# Patient Record
Sex: Male | Born: 1970 | Hispanic: No | Smoking: Current some day smoker
Health system: Southern US, Community
[De-identification: ages and names within clinical notes are randomized; demographics above are authoritative.]

## PROBLEM LIST (undated history)

## (undated) ENCOUNTER — Ambulatory Visit: Admission: EM | Payer: Commercial Managed Care - PPO

## (undated) DIAGNOSIS — G4733 Obstructive sleep apnea (adult) (pediatric): Secondary | ICD-10-CM

## (undated) DIAGNOSIS — F319 Bipolar disorder, unspecified: Secondary | ICD-10-CM

## (undated) DIAGNOSIS — E162 Hypoglycemia, unspecified: Secondary | ICD-10-CM

## (undated) DIAGNOSIS — K219 Gastro-esophageal reflux disease without esophagitis: Secondary | ICD-10-CM

## (undated) DIAGNOSIS — M5412 Radiculopathy, cervical region: Secondary | ICD-10-CM

## (undated) DIAGNOSIS — J4520 Mild intermittent asthma, uncomplicated: Secondary | ICD-10-CM

## (undated) HISTORY — DX: Mild intermittent asthma, uncomplicated: J45.20

## (undated) HISTORY — DX: Obstructive sleep apnea (adult) (pediatric): G47.33

## (undated) HISTORY — PX: TONSILLECTOMY: SUR1361

## (undated) HISTORY — DX: Radiculopathy, cervical region: M54.12

## (undated) HISTORY — DX: Gastro-esophageal reflux disease without esophagitis: K21.9

## (undated) HISTORY — DX: Bipolar disorder, unspecified: F31.9

---

## 1991-11-26 HISTORY — PX: NASAL FRACTURE SURGERY: SHX718

## 2007-04-15 ENCOUNTER — Ambulatory Visit: Payer: Self-pay | Admitting: Internal Medicine

## 2007-05-05 ENCOUNTER — Emergency Department: Payer: Self-pay | Admitting: Unknown Physician Specialty

## 2007-06-10 ENCOUNTER — Emergency Department: Payer: Self-pay | Admitting: Emergency Medicine

## 2008-03-17 ENCOUNTER — Inpatient Hospital Stay: Payer: Self-pay | Admitting: Psychiatry

## 2008-04-10 ENCOUNTER — Other Ambulatory Visit: Payer: Self-pay

## 2008-04-11 ENCOUNTER — Inpatient Hospital Stay: Payer: Self-pay | Admitting: Psychiatry

## 2009-09-16 ENCOUNTER — Inpatient Hospital Stay (HOSPITAL_COMMUNITY): Admission: AD | Admit: 2009-09-16 | Discharge: 2009-10-09 | Payer: Self-pay | Admitting: Psychiatry

## 2009-09-16 ENCOUNTER — Ambulatory Visit: Payer: Self-pay | Admitting: Psychiatry

## 2009-10-03 ENCOUNTER — Emergency Department (HOSPITAL_COMMUNITY): Admission: EM | Admit: 2009-10-03 | Discharge: 2009-10-03 | Payer: Self-pay | Admitting: Emergency Medicine

## 2011-02-27 LAB — SEX HORMONE BINDING GLOBULIN: Sex Hormone Binding: 17 nmol/L (ref 13–71)

## 2011-02-28 LAB — T3, FREE: T3, Free: 3.6 pg/mL (ref 2.3–4.2)

## 2011-02-28 LAB — FREE PSA: PSA, Free: 0.1 ng/mL

## 2011-02-28 LAB — HEPATITIS PANEL, ACUTE
HCV Ab: NEGATIVE
Hep A IgM: NEGATIVE

## 2011-02-28 LAB — T4, FREE: Free T4: 1.39 ng/dL (ref 0.80–1.80)

## 2011-02-28 LAB — HEPATITIS A ANTIBODY, IGM: Hep A IgM: NEGATIVE

## 2011-02-28 LAB — SEX HORMONE BINDING GLOBULIN: Sex Hormone Binding: 18 nmol/L (ref 13–71)

## 2017-06-16 ENCOUNTER — Ambulatory Visit
Admission: EM | Admit: 2017-06-16 | Discharge: 2017-06-16 | Disposition: A | Discharge status: Home / Self Care | Payer: 59 | Attending: Family Medicine | Admitting: Family Medicine

## 2017-06-16 ENCOUNTER — Encounter: Payer: Self-pay | Admitting: *Deleted

## 2017-06-16 ENCOUNTER — Ambulatory Visit (INDEPENDENT_AMBULATORY_CARE_PROVIDER_SITE_OTHER): Payer: 59

## 2017-06-16 DIAGNOSIS — R0602 Shortness of breath: Secondary | ICD-10-CM | POA: Diagnosis not present

## 2017-06-16 DIAGNOSIS — R0781 Pleurodynia: Secondary | ICD-10-CM

## 2017-06-16 DIAGNOSIS — R071 Chest pain on breathing: Secondary | ICD-10-CM | POA: Insufficient documentation

## 2017-06-16 DIAGNOSIS — F17200 Nicotine dependence, unspecified, uncomplicated: Secondary | ICD-10-CM | POA: Insufficient documentation

## 2017-06-16 DIAGNOSIS — M549 Dorsalgia, unspecified: Secondary | ICD-10-CM | POA: Diagnosis present

## 2017-06-16 DIAGNOSIS — M5489 Other dorsalgia: Secondary | ICD-10-CM | POA: Diagnosis not present

## 2017-06-16 LAB — FIBRIN DERIVATIVES D-DIMER (ARMC ONLY): Fibrin derivatives D-dimer (ARMC): 157.1 (ref 0.00–499.00)

## 2017-06-16 MED ORDER — PREDNISONE 20 MG PO TABS
ORAL_TABLET | ORAL | 0 refills | Status: DC
Start: 2017-06-16 — End: 2018-09-28

## 2017-06-16 NOTE — ED Provider Notes (Signed)
[facility]    CSN: [id number] Arrival date & time: 06/16/17  1837     History   Chief Complaint Chief Complaint  Patient presents with  . Chest Pain  . Shortness of Breath  . Back Pain    HPI Nicholas Horne is a 46 y.o. male.   The history is provided by the patient.  Chest Pain  Pain location:  Substernal area Pain quality: sharp   Pain radiates to:  Does not radiate Pain severity:  Mild Onset quality:  Sudden Duration:  1 week Timing:  Intermittent Progression:  Waxing and waning Chronicity:  New Context comment:  Worse with deep breaths Relieved by:  None tried Worsened by:  Deep breathing Associated symptoms: back pain and shortness of breath   Associated symptoms: no abdominal pain, no AICD problem, no altered mental status, no anorexia, no anxiety, no claudication, no cough, no dizziness, no dysphagia, no fatigue, no fever, no headache, no heartburn, no lower extremity edema, no nausea, no numbness, no orthopnea, no palpitations, no PND, no syncope, no vomiting and no weakness   Shortness of Breath  Associated symptoms: chest pain   Associated symptoms: no abdominal pain, no claudication, no cough, no fever, no headaches, no PND, no syncope and no vomiting   Back Pain  Associated symptoms: chest pain   Associated symptoms: no abdominal pain, no fever, no headaches, no numbness and no weakness     History reviewed. No pertinent past medical history.  There are no active problems to display for this patient.   History reviewed. No pertinent surgical history.  Wells Criteria=0   Home Medications    Prior to Admission medications   Medication Sig Start Date End Date Taking? Authorizing Provider  predniSONE (DELTASONE) 20 MG tablet 2 tabs po qd for 5 days 06/16/17   Payton Mccallumonty, Milissa Fesperman, MD    Family History History reviewed. No pertinent family history.  Social History Social History  Substance Use Topics  . Smoking status: Current Every  Day Smoker  . Smokeless tobacco: Never Used  . Alcohol use No     Allergies   Patient has no known allergies.   Review of Systems Review of Systems  Constitutional: Negative for fatigue and fever.  HENT: Negative for trouble swallowing.   Respiratory: Positive for shortness of breath. Negative for cough.   Cardiovascular: Positive for chest pain. Negative for palpitations, orthopnea, claudication, syncope and PND.  Gastrointestinal: Negative for abdominal pain, anorexia, heartburn, nausea and vomiting.  Musculoskeletal: Positive for back pain.  Neurological: Negative for dizziness, weakness, numbness and headaches.     Physical Exam Triage Vital Signs ED Triage Vitals  Enc Vitals Group     BP 06/16/17 1852 125/79     Pulse Rate 06/16/17 1852 69     Resp 06/16/17 1852 16     Temp 06/16/17 1852 97.9 F (36.6 C)     Temp Source 06/16/17 1852 Oral     SpO2 06/16/17 1852 99 %     Weight 06/16/17 1854 197 lb (89.4 kg)     Height 06/16/17 1854 5\' 10"  (1.778 m)     Head Circumference --      Peak Flow --      Pain Score 06/16/17 1854 5     Pain Loc --      Pain Edu? --      Excl. in GC? --    No data found.   Updated Vital Signs BP 125/79 (BP Location:  Left Arm)   Pulse 69   Temp 97.9 F (36.6 C) (Oral)   Resp 16   Ht 5\' 10"  (1.778 m)   Wt 197 lb (89.4 kg)   SpO2 99%   BMI 28.27 kg/m   Visual Acuity Right Eye Distance:   Left Eye Distance:   Bilateral Distance:    Right Eye Near:   Left Eye Near:    Bilateral Near:     Physical Exam  Constitutional: He appears well-developed and well-nourished. No distress.  HENT:  Head: Normocephalic and atraumatic.  Right Ear: Tympanic membrane, external ear and ear canal normal.  Left Ear: Tympanic membrane, external ear and ear canal normal.  Nose: Nose normal.  Mouth/Throat: Uvula is midline, oropharynx is clear and moist and mucous membranes are normal. No oropharyngeal exudate or tonsillar abscesses.  Eyes:  Pupils are equal, round, and reactive to light.  Neck: Normal range of motion. Neck supple. No tracheal deviation present.  Cardiovascular: Normal rate, regular rhythm, normal heart sounds and intact distal pulses.   Pulmonary/Chest: Effort normal and breath sounds normal. No stridor. No respiratory distress. He has no wheezes. He has no rales. He exhibits no tenderness.  Abdominal: Soft. Bowel sounds are normal. He exhibits no distension. There is no tenderness.  Neurological: He is alert.  Skin: Skin is warm and dry. No rash noted. He is not diaphoretic.  Nursing note and vitals reviewed.    UC Treatments / Results  Labs (all labs ordered are listed, but only abnormal results are displayed) Labs Reviewed  FIBRIN DERIVATIVES D-DIMER [facility] ONLY)    EKG  EKG Interpretation None       Radiology Dg Chest 2 View  Result Date: 06/16/2017 CLINICAL DATA:  46 year old male with shortness of breath weakness for the past week and now chest pain EXAM: CHEST  2 VIEW COMPARISON:  None. FINDINGS: The lungs are clear and negative for focal airspace consolidation, pulmonary edema or suspicious pulmonary nodule. No pleural effusion or pneumothorax. Cardiac and mediastinal contours are within normal limits. No acute fracture or lytic or blastic osseous lesions. The visualized upper abdominal bowel gas pattern is unremarkable. IMPRESSION: Negative chest x-ray. Electronically Signed   By: Malachy Moan M.D.   On: 06/16/2017 19:22    Procedures ED EKG Date/Time: 06/16/2017 7:36 PM Performed by: Payton Mccallum Authorized by: Payton Mccallum   ECG reviewed by ED Physician in the absence of a cardiologist: yes   Previous ECG:    Previous ECG:  Unavailable Interpretation:    Interpretation: normal   Rate:    ECG rate:  62   ECG rate assessment: normal   Rhythm:    Rhythm: sinus rhythm   Ectopy:    Ectopy: none   QRS:    QRS axis:  Normal Conduction:    Conduction: normal   ST segments:      ST segments:  Normal T waves:    T waves: normal      (including critical care time)  Medications Ordered in UC Medications - No data to display   Initial Impression / Assessment and Plan / UC Course  I have reviewed the triage vital signs and the nursing notes.  Pertinent labs & imaging results that were available during my care of the patient were reviewed by me and considered in my medical decision making (see chart for details).       Final Clinical Impressions(s) / UC Diagnoses   Final diagnoses:  Chest pain on breathing  Pleuritic chest pain    New Prescriptions Discharge Medication List as of 06/16/2017  8:30 PM    START taking these medications   Details  predniSONE (DELTASONE) 20 MG tablet 2 tabs po qd for 5 days, Normal       1. Labs/x-ray/ekg results (negative) and diagnosis reviewed with patient 2. rx as per orders above; reviewed possible side effects, interactions, risks and benefits  3. Recommend supportive treatment with otc analgesics prn 4. Follow-up prn if symptoms worsen or don't improve   Payton Mccallum, MD 06/16/17 2035

## 2017-06-16 NOTE — ED Triage Notes (Signed)
Chest pain with respiration, itchy throat, states "I can't get a full breath" also low back pain, onset 1 week ago. Symptoms started off mild and have progressed.

## 2018-09-28 ENCOUNTER — Other Ambulatory Visit: Payer: Self-pay

## 2018-09-28 ENCOUNTER — Emergency Department: Payer: Self-pay

## 2018-09-28 ENCOUNTER — Emergency Department
Admission: EM | Admit: 2018-09-28 | Discharge: 2018-09-28 | Disposition: A | Discharge status: Other Acute Inpatient Hospital | Payer: Self-pay | Attending: Emergency Medicine | Admitting: Emergency Medicine

## 2018-09-28 ENCOUNTER — Encounter: Payer: Self-pay | Admitting: Psychiatry

## 2018-09-28 ENCOUNTER — Inpatient Hospital Stay
Admission: RE | Admit: 2018-09-28 | Discharge: 2018-10-02 | DRG: 885 | Disposition: A | Discharge status: Home / Self Care | Payer: No Typology Code available for payment source | Source: Ambulatory Visit | Attending: Psychiatry | Admitting: Psychiatry

## 2018-09-28 DIAGNOSIS — F17210 Nicotine dependence, cigarettes, uncomplicated: Secondary | ICD-10-CM | POA: Diagnosis present

## 2018-09-28 DIAGNOSIS — F315 Bipolar disorder, current episode depressed, severe, with psychotic features: Secondary | ICD-10-CM | POA: Insufficient documentation

## 2018-09-28 DIAGNOSIS — F314 Bipolar disorder, current episode depressed, severe, without psychotic features: Principal | ICD-10-CM | POA: Diagnosis present

## 2018-09-28 DIAGNOSIS — R45851 Suicidal ideations: Secondary | ICD-10-CM

## 2018-09-28 DIAGNOSIS — F1490 Cocaine use, unspecified, uncomplicated: Secondary | ICD-10-CM | POA: Diagnosis present

## 2018-09-28 DIAGNOSIS — G4700 Insomnia, unspecified: Secondary | ICD-10-CM | POA: Diagnosis present

## 2018-09-28 DIAGNOSIS — F429 Obsessive-compulsive disorder, unspecified: Secondary | ICD-10-CM | POA: Diagnosis present

## 2018-09-28 DIAGNOSIS — F319 Bipolar disorder, unspecified: Secondary | ICD-10-CM

## 2018-09-28 DIAGNOSIS — D72829 Elevated white blood cell count, unspecified: Secondary | ICD-10-CM

## 2018-09-28 DIAGNOSIS — J40 Bronchitis, not specified as acute or chronic: Secondary | ICD-10-CM | POA: Diagnosis present

## 2018-09-28 DIAGNOSIS — F1420 Cocaine dependence, uncomplicated: Secondary | ICD-10-CM

## 2018-09-28 DIAGNOSIS — D72828 Other elevated white blood cell count: Secondary | ICD-10-CM

## 2018-09-28 DIAGNOSIS — F17200 Nicotine dependence, unspecified, uncomplicated: Secondary | ICD-10-CM | POA: Insufficient documentation

## 2018-09-28 DIAGNOSIS — F4310 Post-traumatic stress disorder, unspecified: Secondary | ICD-10-CM | POA: Diagnosis present

## 2018-09-28 LAB — URINE DRUG SCREEN, QUALITATIVE (ARMC ONLY)
AMPHETAMINES, UR SCREEN: NOT DETECTED
Barbiturates, Ur Screen: NOT DETECTED
Benzodiazepine, Ur Scrn: NOT DETECTED
COCAINE METABOLITE, UR ~~LOC~~: POSITIVE — AB
Cannabinoid 50 Ng, Ur ~~LOC~~: NOT DETECTED
MDMA (Ecstasy)Ur Screen: NOT DETECTED
Methadone Scn, Ur: NOT DETECTED
OPIATE, UR SCREEN: NOT DETECTED
Phencyclidine (PCP) Ur S: NOT DETECTED
Tricyclic, Ur Screen: NOT DETECTED

## 2018-09-28 LAB — COMPREHENSIVE METABOLIC PANEL
ALT: 50 U/L — AB (ref 0–44)
AST: 49 U/L — AB (ref 15–41)
Albumin: 4.7 g/dL (ref 3.5–5.0)
Alkaline Phosphatase: 60 U/L (ref 38–126)
Anion gap: 9 (ref 5–15)
BUN: 19 mg/dL (ref 6–20)
CO2: 28 mmol/L (ref 22–32)
CREATININE: 1.33 mg/dL — AB (ref 0.61–1.24)
Calcium: 9.3 mg/dL (ref 8.9–10.3)
Chloride: 102 mmol/L (ref 98–111)
GFR calc Af Amer: >60 mL/min (ref >60)
GFR calc non Af Amer: >60 mL/min (ref >60)
Glucose, Bld: 118 mg/dL — ABNORMAL HIGH (ref 70–99)
Potassium: 4.4 mmol/L (ref 3.5–5.1)
SODIUM: 139 mmol/L (ref 135–145)
Total Bilirubin: 1.3 mg/dL — ABNORMAL HIGH (ref 0.3–1.2)
Total Protein: 8.5 g/dL — ABNORMAL HIGH (ref 6.5–8.1)

## 2018-09-28 LAB — DIFFERENTIAL
Basophils Absolute: 0.1 10*3/uL (ref 0.0–0.1)
Basophils Relative: 0 %
Eosinophils Absolute: 0 10*3/uL (ref 0.0–0.5)
Eosinophils Relative: 0 %
LYMPHS ABS: 2 10*3/uL (ref 0.7–4.0)
LYMPHS PCT: 11 %
MONO ABS: 2 10*3/uL — AB (ref 0.1–1.0)
MONOS PCT: 11 %
NEUTROS ABS: 14.4 10*3/uL — AB (ref 1.7–7.7)
Neutrophils Relative %: 77 %

## 2018-09-28 LAB — CBC
HEMATOCRIT: 48.4 % (ref 39.0–52.0)
Hemoglobin: 16.3 g/dL (ref 13.0–17.0)
MCH: 30.8 pg (ref 26.0–34.0)
MCHC: 33.7 g/dL (ref 30.0–36.0)
MCV: 91.5 fL (ref 80.0–100.0)
NRBC: 0 % (ref 0.0–0.2)
Platelets: 294 10*3/uL (ref 150–400)
RBC: 5.29 MIL/uL (ref 4.22–5.81)
RDW: 14.2 % (ref 11.5–15.5)
WBC: 19.1 10*3/uL — ABNORMAL HIGH (ref 4.0–10.5)

## 2018-09-28 LAB — SALICYLATE LEVEL: Salicylate Lvl: <7 mg/dL (ref 2.8–30.0)

## 2018-09-28 LAB — TROPONIN I: Troponin I: <0.03 ng/mL (ref <0.03)

## 2018-09-28 LAB — ACETAMINOPHEN LEVEL: Acetaminophen (Tylenol), Serum: <10 ug/mL — ABNORMAL LOW (ref 10–30)

## 2018-09-28 LAB — ETHANOL: Alcohol, Ethyl (B): <10 mg/dL (ref <10)

## 2018-09-28 MED ORDER — ALUM & MAG HYDROXIDE-SIMETH 200-200-20 MG/5ML PO SUSP
30.0000 mL | ORAL | Status: DC | PRN
  Administered 2018-10-02: 30 mL via ORAL
  Filled 2018-09-28: qty 30

## 2018-09-28 MED ORDER — ACETAMINOPHEN 500 MG PO TABS
1000.0000 mg | ORAL_TABLET | Freq: Once | ORAL | Status: AC
  Administered 2018-09-28: 1000 mg via ORAL
  Filled 2018-09-28: qty 2

## 2018-09-28 MED ORDER — TRAZODONE HCL 100 MG PO TABS: 100.0000 mg | ORAL_TABLET | Freq: Every evening | ORAL | Status: DC | PRN

## 2018-09-28 MED ORDER — MAGNESIUM HYDROXIDE 400 MG/5ML PO SUSP: 30.0000 mL | Freq: Every day | ORAL | Status: DC | PRN

## 2018-09-28 MED ORDER — HYDROXYZINE HCL 50 MG PO TABS
50.0000 mg | ORAL_TABLET | Freq: Three times a day (TID) | ORAL | Status: DC | PRN
  Administered 2018-09-28: 50 mg via ORAL
  Filled 2018-09-28: qty 1

## 2018-09-28 MED ORDER — ACETAMINOPHEN 325 MG PO TABS: 650.0000 mg | ORAL_TABLET | Freq: Four times a day (QID) | ORAL | Status: DC | PRN

## 2018-09-28 NOTE — ED Notes (Addendum)
[employer] officer at bedside during dress out. Vernona Rieger, ED Tech at bedside for dress out. Pair of brown boots, yellow shirt, silver colored cross necklace, brown belt, jeans, white socks, underwear and gym shorts, wallet-declines needing locked up in safe. Red cell phone placed in labeled patient belonging bag.

## 2018-09-28 NOTE — BH Assessment (Addendum)
Assessment Note  Nicholas Horne is an 47 y.o. male who presents to ED endorsing suicidal ideation with a plan to hang himself with a belt. He reports he borrowed a shotgun from his father and told his father it was to go hunting. He then told this Clinical research associate "it wasn't for hunting" (implying he was going to use the gun as a means to end his life). He denies HI/AVH. He reports sxs of depression - isolation, guilt, hopelessness, worthlessness. He shared that his only reason he has not followed through with his suicidal plans is because of "my 5yo son". He reports a recent "slip" using cocaine today. He currently lives with his wife and 5yo son. Pt also reports poor sleep patterns - 2-3 hours of sleep.  Pt reports past psychiatric hospitalization with [facility] in 2009 for similar presenting problems. Also reports various trials with different medications that he reports were ineffective (lithium and seroquel).   Pt denied current legal involvement.  Pt was alert and oriented x4, with a flat affect. Pt was pleasant and cooperative during TTS assessment.  Diagnosis: Bipolar 1 Disorder, Depressed Mood  Past Medical History: History reviewed. No pertinent past medical history.  History reviewed. No pertinent surgical history.  Family History: History reviewed. No pertinent family history.  Social History:  reports that he has been smoking. He has never used smokeless tobacco. He reports that he has current or past drug history. Drug: Cocaine. He reports that he does not drink alcohol.  Additional Social History:  Alcohol / Drug Use Pain Medications: See MAR Prescriptions: See MAR Over the Counter: See MAR History of alcohol / drug use?: Yes Longest period of sobriety (when/how long): 7 years Negative Consequences of Use: Financial, Personal relationships, Work / School Withdrawal Symptoms: Irritability Substance #1 Name of Substance 1: Cocaine 1 - Age of First Use: "Early 30s" 1 - Amount (size/oz):  "$10.00" 1 - Frequency: "a slip" 1 - Duration: UKN 1 - Last Use / Amount: 09/28/2018  CIWA: CIWA-Ar BP: 134/84 Pulse Rate: (!) 111 COWS:    Allergies: No Known Allergies  Home Medications:  (Not in a hospital admission)  OB/GYN Status:  No LMP for male patient.  General Assessment Data Location of Assessment: [facility] ED TTS Assessment: In system Is this a Tele or Face-to-Face Assessment?: Face-to-Face Is this an Initial Assessment or a Re-assessment for this encounter?: Initial Assessment Patient Accompanied by:: N/A Language Other than English: No Living Arrangements: Other (Comment)(Independent living) What gender do you identify as?: Male Marital status: Married [place] name: n/a Pregnancy Status: No Living Arrangements: Spouse/significant other, Children Can pt return to current living arrangement?: Yes Admission Status: Involuntary Petitioner: ED Attending Is patient capable of signing voluntary admission?: No Referral Source: Self/Family/Friend Insurance type: None  Medical Screening Exam [facility] Walk-in ONLY) Medical Exam completed: Yes  Crisis Care Plan Living Arrangements: Spouse/significant other, Children Legal Guardian: Other:(Self) Name of Psychiatrist: None Name of Therapist: None  Education Status Is patient currently in school?: No Is the patient employed, unemployed or receiving disability?: Employed([employer]  Risk to self with the past 6 months Suicidal Ideation: Yes-Currently Present Has patient been a risk to self within the past 6 months prior to admission? : Yes Suicidal Intent: Yes-Currently Present Has patient had any suicidal intent within the past 6 months prior to admission? : Yes Is patient at risk for suicide?: Yes Suicidal Plan?: Yes-Currently Present Has patient had any suicidal plan within the past 6 months prior to  admission? : Yes Specify Current Suicidal Plan: To hang himself with a belt or a shotgun. Access to  Means: Yes Specify Access to Suicidal Means: Access to a belt and a shotgun. What has been your use of drugs/alcohol within the last 12 months?: Cocaine Previous Attempts/Gestures: No How many times?: 0 Other Self Harm Risks: None Triggers for Past Attempts: None known Intentional Self Injurious Behavior: None Family Suicide History: Unknown Recent stressful life event(s): Conflict (Comment), Financial Problems, Other (Comment)(Relapse, Conflict with wife.) Persecutory voices/beliefs?: No Depression: Yes Depression Symptoms: Despondent, Insomnia, Tearfulness, Isolating, Fatigue, Guilt, Loss of interest in usual pleasures, Feeling worthless/self pity, Feeling angry/irritable Substance abuse history and/or treatment for substance abuse?: Yes Suicide prevention information given to non-admitted patients: Not applicable  Risk to Others within the past 6 months Homicidal Ideation: No Does patient have any lifetime risk of violence toward others beyond the six months prior to admission? : No Thoughts of Harm to Others: No Current Homicidal Intent: No Current Homicidal Plan: No Access to Homicidal Means: No Identified Victim: None History of harm to others?: No Assessment of Violence: None Noted Violent Behavior Description: None Does patient have access to weapons?: Yes (Comment)(Pt reports he has access to a shotgun.) Criminal Charges Pending?: No Does patient have a court date: No Is patient on probation?: No  Psychosis Hallucinations: None noted Delusions: None noted  Mental Status Report Appearance/Hygiene: In scrubs Eye Contact: Poor Motor Activity: Freedom of movement, Unremarkable Speech: Logical/coherent Level of Consciousness: Alert Mood: Depressed, Sad Affect: Flat, Depressed Anxiety Level: Minimal Thought Processes: Coherent, Relevant Judgement: Impaired Orientation: Person, Place, Time, Situation, Appropriate for developmental age Obsessive Compulsive  Thoughts/Behaviors: None  Cognitive Functioning Concentration: Normal Memory: Recent Intact, Remote Intact Is patient IDD: No Insight: Poor Impulse Control: Fair Appetite: Good Have you had any weight changes? : No Change Sleep: Decreased Total Hours of Sleep: 2 Vegetative Symptoms: None  ADLScreening [facility] Assessment Services) Patient's cognitive ability adequate to safely complete daily activities?: Yes Patient able to express need for assistance with ADLs?: Yes Independently performs ADLs?: Yes (appropriate for developmental age)  Prior Inpatient Therapy Prior Inpatient Therapy: Yes Prior Therapy Dates: 2009 Prior Therapy Facilty/Provider(s): [facility] Reason for Treatment: SI, Depression  Prior Outpatient Therapy Prior Outpatient Therapy: No Does patient have an ACCT team?: No Does patient have Intensive In-House Services?  : No Does patient have [facility] services? : No Does patient have P4CC services?: No  ADL Screening (condition at time of admission) Patient's cognitive ability adequate to safely complete daily activities?: Yes Patient able to express need for assistance with ADLs?: Yes Independently performs ADLs?: Yes (appropriate for developmental age)       Abuse/Neglect Assessment (Assessment to be complete while patient is alone) Abuse/Neglect Assessment Can Be Completed: Yes Physical Abuse: Denies Verbal Abuse: Denies Sexual Abuse: Denies Exploitation of patient/patient's resources: Denies Self-Neglect: Denies Values / Beliefs Cultural Requests During Hospitalization: None Spiritual Requests During Hospitalization: None Consults Spiritual Care Consult Needed: No Social Work Consult Needed: No Merchant navy officer (For Healthcare) Does Patient Have a Medical Advance Directive?: No       Child/Adolescent Assessment Running Away Risk: (Patient is an adult)  Disposition:  Disposition Initial Assessment Completed for this Encounter: Yes Disposition of  Patient: Admit Type of inpatient treatment program: Adult Patient refused recommended treatment: No Mode of transportation if patient is discharged?: N/A Patient referred to: [facility] BMU)  On Site Evaluation by:   Reviewed with Physician:    Wilmon Arms 09/28/2018 7:12 PM

## 2018-09-28 NOTE — ED Notes (Signed)
PT  PLACED UNDER  IVC PAPERS  PER  DR [facility]  INFORMED  RN  RHEA AND  ODS  OFFICER

## 2018-09-28 NOTE — ED Notes (Signed)
Report given to [facility] in [place]. Patient to be transferred after 2300. Patient agreeable with plan of care.

## 2018-09-28 NOTE — ED Notes (Signed)
Pt states that he feels overwhelmed.  He still endorses suicidal ideation here at the hospital.  Pt states he worries about work.  He states that the only reason that he didn't harm himself was because he has a 46 year old son.  Pt alert and oriented x 4.  Appears to be in no acute distress.

## 2018-09-28 NOTE — ED Provider Notes (Signed)
[facility] Emergency Department Provider Note   ____________________________________________   First MD Initiated Contact with Patient 09/28/18 1550     (approximate)  I have reviewed the triage vital signs and the nursing notes.   HISTORY  Chief Complaint Suicidal    HPI Nicholas Horne is a 47 y.o. male who reports he is feeling depressed like he wants to end it all.  He was thinking of hanging himself with his belt.  He is mumbling his history and hard to understand everything what sounds like somebody stopped them.  He came here for help.  He says his life is working everything is doing well but he just is tired wants to check out.   History reviewed. No pertinent past medical history.  Patient Active Problem List   Diagnosis Date Noted  . Bipolar 1 disorder, depressed (HCC) 09/28/2018  . Cocaine abuse (HCC) 09/28/2018  . Suicidal ideation 09/28/2018    History reviewed. No pertinent surgical history.  Prior to Admission medications   Not on File    Allergies Patient has no known allergies.  History reviewed. No pertinent family history.  Social History Social History   Tobacco Use  . Smoking status: Current Every Day Smoker  . Smokeless tobacco: Never Used  Substance Use Topics  . Alcohol use: No  . Drug use: Yes    Types: Cocaine    Review of Systems  Constitutional: No fever/chills Eyes: No visual changes. ENT: No sore throat. Cardiovascular: Denies chest pain. Respiratory: Denies shortness of breath. Gastrointestinal: No abdominal pain.  No nausea, no vomiting.  No diarrhea.  No constipation. Genitourinary: Negative for dysuria. Musculoskeletal: Negative for back pain. Skin: Negative for rash. Neurological: Negative for headaches, focal weakness   ____________________________________________   PHYSICAL EXAM:  VITAL SIGNS: ED Triage Vitals  Enc Vitals Group     BP 09/28/18 1530 (!) 155/82     Pulse Rate  09/28/18 1530 (!) 111     Resp --      Temp 09/28/18 1530 98.3 F (36.8 C)     Temp Source 09/28/18 1530 Oral     SpO2 09/28/18 1530 99 %     Weight 09/28/18 1532 208 lb (94.3 kg)     Height 09/28/18 1532 5\' 11"  (1.803 m)     Head Circumference --      Peak Flow --      Pain Score 09/28/18 1532 0     Pain Loc --      Pain Edu? --      Excl. in GC? --    Constitutional: Alert and oriented. Well appearing and in no acute distress. Eyes: Conjunctivae are normal. PER. EOMI. Head: Atraumatic. Nose: No congestion/rhinnorhea. Mouth/Throat: Mucous membranes are moist.  Oropharynx non-erythematous. Neck: No stridor.   Cardiovascular: Normal rate, regular rhythm. Grossly normal heart sounds.  Good peripheral circulation. Respiratory: Normal respiratory effort.  No retractions. Lungs CTAB. Gastrointestinal: Soft and nontender. No distention. No abdominal bruits. No CVA tenderness. Musculoskeletal: No lower extremity tenderness nor edema.   Neurologic:  Normal speech and language. No gross focal neurologic deficits are appreciated. No gait instability. Skin:  Skin is warm, dry and intact. No rash noted. Psychiatric: Mood and affect are normal. Speech and behavior are normal.  ____________________________________________   LABS (all labs ordered are listed, but only abnormal results are displayed)  Labs Reviewed  COMPREHENSIVE METABOLIC PANEL - Abnormal; Notable for the following components:      Result Value  Glucose, Bld 118 (*)    Creatinine, Ser 1.33 (*)    Total Protein 8.5 (*)    AST 49 (*)    ALT 50 (*)    Total Bilirubin 1.3 (*)    All other components within normal limits  ACETAMINOPHEN LEVEL - Abnormal; Notable for the following components:   Acetaminophen (Tylenol), Serum <10 (*)    All other components within normal limits  CBC - Abnormal; Notable for the following components:   WBC 19.1 (*)    All other components within normal limits  URINE DRUG SCREEN, QUALITATIVE  ([facility] ONLY) - Abnormal; Notable for the following components:   Cocaine Metabolite,Ur Protection POSITIVE (*)    All other components within normal limits  DIFFERENTIAL - Abnormal; Notable for the following components:   Neutro Abs 14.4 (*)    Monocytes Absolute 2.0 (*)    All other components within normal limits  ETHANOL  SALICYLATE LEVEL  TROPONIN I   ____________________________________________  EKG   ____________________________________________  RADIOLOGY  ED MD interpretation:    Official radiology report(s): Dg Chest 2 View  Result Date: 09/28/2018 CLINICAL DATA:  Elevated white count EXAM: CHEST - 2 VIEW COMPARISON:  06/16/2017 FINDINGS: The heart size and mediastinal contours are within normal limits. Both lungs are clear. The visualized skeletal structures are unremarkable. IMPRESSION: No active cardiopulmonary disease. Electronically Signed   By: Jasmine Pang M.D.   On: 09/28/2018 18:03   US Abdomen Limited Ruq  Result Date: 09/28/2018 CLINICAL DATA:  Fatigue with leukocytosis. EXAM: ULTRASOUND ABDOMEN LIMITED RIGHT UPPER QUADRANT COMPARISON:  None. FINDINGS: Gallbladder: Tiny cholesterol polyps evident. No findings to suggest gallstones. No gallbladder wall thickening or pericholecystic fluid. The sonographer reports no sonographic Murphy sign. Common bile duct: Diameter: 2 mm Liver: No focal lesion identified. Within normal limits in parenchymal echogenicity. Portal vein is patent on color Doppler imaging with normal direction of blood flow towards the liver. IMPRESSION: 1. No acute findings. No evidence to explain the patient's history of leukocytosis. Electronically Signed   By: Kennith Center M.D.   On: 09/28/2018 18:23    ____________________________________________   PROCEDURES  Procedure(s) performed:   Procedures  Critical Care performed:   ____________________________________________   INITIAL IMPRESSION / ASSESSMENT AND PLAN / ED COURSE  We will consult  TTS and psychiatry.  This gentleman is suicidal. Patient to be admitted downstairs.  Not sure why he has an elevated white count.  His physical exam is essentially normal.  His chest x-ray is negative LFTs are slightly elevated but his ultrasound is normal his troponin is negative I think we will just have him watch downstairs.        ____________________________________________   FINAL CLINICAL IMPRESSION(S) / ED DIAGNOSES  Final diagnoses:  Suicidal ideation  Other elevated white blood cell (WBC) count     ED Discharge Orders    None       Note:  This document was prepared using Dragon voice recognition software and may include unintentional dictation errors.    Arnaldo Natal, MD 09/28/18 2019
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
## 2018-09-28 NOTE — BH Assessment (Signed)
Patient is to be admitted to [facility] by Dr. Toni Amend.  Attending Physician will be Dr. Jennet Maduro.   Patient has been assigned to room 306-A, by [facility] Charge Nurse Gigi.   Intake Paper Work has been signed and placed on patient chart.  ER staff is aware of the admission:  [facility] ER Secretary    Dr. Darnelle Catalan, ER MD   Prudencio Burly, Patient's Nurse   Ethelene Browns, Patient Access.

## 2018-09-28 NOTE — ED Notes (Signed)
IV[facility]/  PENDING  PLACEMENT 

## 2018-09-28 NOTE — ED Triage Notes (Signed)
Pt to ER via POV stating that he is having suicidal thoughts. Pt states that he wanted to hang himself with belt today. Denies HI.

## 2018-09-28 NOTE — ED Notes (Signed)
Prudencio Burly, RN and charge RN Judeth Cornfield informed that 1:1 suicidal precautions are ordered for patient due to being actively suicidal.

## 2018-09-29 ENCOUNTER — Encounter: Payer: Self-pay | Admitting: Psychiatry

## 2018-09-29 DIAGNOSIS — F17200 Nicotine dependence, unspecified, uncomplicated: Secondary | ICD-10-CM | POA: Diagnosis present

## 2018-09-29 LAB — LIPID PANEL
CHOL/HDL RATIO: 4.5 ratio
CHOLESTEROL: 193 mg/dL (ref 0–200)
HDL: 43 mg/dL (ref >40)
LDL CALC: 137 mg/dL — AB (ref 0–99)
TRIGLYCERIDES: 66 mg/dL (ref <150)
VLDL: 13 mg/dL (ref 0–40)

## 2018-09-29 LAB — HEMOGLOBIN A1C
Hgb A1c MFr Bld: 5.3 % (ref 4.8–5.6)
Mean Plasma Glucose: 105.41 mg/dL

## 2018-09-29 LAB — TSH: TSH: 1.025 u[IU]/mL (ref 0.350–4.500)

## 2018-09-29 MED ORDER — ZOLPIDEM TARTRATE 5 MG PO TABS: 10.0000 mg | ORAL_TABLET | Freq: Every day | ORAL | Status: DC

## 2018-09-29 MED ORDER — ARIPIPRAZOLE 10 MG PO TABS
10.0000 mg | ORAL_TABLET | Freq: Every day | ORAL | Status: DC
  Administered 2018-09-29 – 2018-09-30 (×2): 10 mg via ORAL
  Filled 2018-09-29 (×2): qty 1

## 2018-09-29 MED ORDER — CARBAMAZEPINE 200 MG PO TABS
200.0000 mg | ORAL_TABLET | Freq: Two times a day (BID) | ORAL | Status: DC
  Filled 2018-09-29: qty 1

## 2018-09-29 MED ORDER — NICOTINE 21 MG/24HR TD PT24
21.0000 mg | MEDICATED_PATCH | Freq: Every day | TRANSDERMAL | Status: DC
  Filled 2018-09-29: qty 1

## 2018-09-29 NOTE — H&P (Signed)
Psychiatric Admission Assessment Adult  Patient Identification: Nicholas Horne MRN:  [id number] Date of Evaluation:  09/29/2018 Chief Complaint:  Bipolar 1 Disorder Principal Diagnosis: Bipolar I disorder, most recent episode depressed, severe without psychotic features (HCC) Diagnosis:   Patient Active Problem List   Diagnosis Date Noted  . Bipolar I disorder, most recent episode depressed, severe without psychotic features (HCC) [F31.4] 09/28/2018    Priority: High  . Tobacco use disorder [F17.200] 09/29/2018  . Cocaine use disorder, moderate, dependence (HCC) [F14.20] 09/28/2018  . Suicidal ideation [R45.851] 09/28/2018   History of Present Illness:  Identifying data. Nicholas Horne is a 47 year old male with a history of bipolar disorder.  Chief complaint. "I have been thinking about killing myself."  History of present illness. Information was obtained from the patient and the chart. The patient came to the ER complaining of worsening of depression with suicidal ideation. He borrowed a shotgun from his father to go "hunting" but talked to his wife and brother and the gun was removed. For the past several months, and especially weeks, he became more disfunctional. He was unable to sleep or focused, became irritable, hyperactive, insomniac, confused, paranoid, easily angered but also suicidal, tearful and emotional. He denies hallucinations. Denies panic attacks or social anxiety. Reports nightmares and flashback of PTSD type and OCD. Used cocaine "once". Denies other drugs.   Past psychatric history. Two prior hospitalization at [facility] for depression and mania. Treated with Lithium and Seroquel that he could not tolerate. Better on Abilify. One aborted suicide with a gun. Prior history of substance abuse.  Family psychiatric history. None.  Social history. Married with 39-year-old child. Works as a Biochemist, clinical. Supportive family.   Total Time spent with patient: 1 hour  Is the patient at  risk to self? Yes.    Has the patient been a risk to self in the past 6 months? No.  Has the patient been a risk to self within the distant past? Yes.    Is the patient a risk to others? No.  Has the patient been a risk to others in the past 6 months? No.  Has the patient been a risk to others within the distant past? No.   Prior Inpatient Therapy:   Prior Outpatient Therapy:    Alcohol Screening: 1. How often do you have a drink containing alcohol?: Never 2. How many drinks containing alcohol do you have on a typical day when you are drinking?: 1 or 2 3. How often do you have six or more drinks on one occasion?: Never AUDIT-C Score: 0 4. How often during the last year have you found that you were not able to stop drinking once you had started?: Never 5. How often during the last year have you failed to do what was normally expected from you becasue of drinking?: Never 6. How often during the last year have you needed a first drink in the morning to get yourself going after a heavy drinking session?: Never 7. How often during the last year have you had a feeling of guilt of remorse after drinking?: Never 8. How often during the last year have you been unable to remember what happened the night before because you had been drinking?: Never 9. Have you or someone else been injured as a result of your drinking?: No 10. Has a relative or friend or a doctor or another health worker been concerned about your drinking or suggested you cut down?: No Alcohol Use Disorder Identification Test Final  Score (AUDIT): 0 Intervention/Follow-up: AUDIT Score <7 follow-up not indicated Substance Abuse History in the last 12 months:  Yes.   Consequences of Substance Abuse: Negative Previous Psychotropic Medications: Yes  Psychological Evaluations: No  Past Medical History: History reviewed. No pertinent past medical history. History reviewed. No pertinent surgical history. Family History: History reviewed. No  pertinent family history.  Tobacco Screening: Have you used any form of tobacco in the last 30 days? (Cigarettes, Smokeless Tobacco, Cigars, and/or Pipes): Yes Tobacco use, Select all that apply: 4 or less cigarettes per day Are you interested in Tobacco Cessation Medications?: No, patient refused Counseled patient on smoking cessation including recognizing danger situations, developing coping skills and basic information about quitting provided: Yes Social History:  Social History   Substance and Sexual Activity  Alcohol Use No     Social History   Substance and Sexual Activity  Drug Use Yes  . Types: Cocaine    Additional Social History: Marital status: Married Number of Years Married: 7 What types of issues is patient dealing with in the relationship?: "My wife gets drunk and becomes violent. She takes everything out on me." Are you sexually active?: Yes What is your sexual orientation?: Heterosexual Has your sexual activity been affected by drugs, alcohol, medication, or emotional stress?: No Does patient have children?: Yes How many children?: 4 How is patient's relationship with their children?: "I dont get to talk to my boys as much, they live in [place] and are working and in school. They have their own lives. My relationship with my son at home is good.                          Allergies:  No Known Allergies Lab Results:  Results for orders placed or performed during the hospital encounter of 09/28/18 (from the past 48 hour(s))  Hemoglobin A1c     Status: None   Collection Time: 09/28/18  3:36 PM  Result Value Ref Range   Hgb A1c MFr Bld 5.3 4.8 - 5.6 %    Comment: (NOTE) Pre diabetes:          5.7%-6.4% Diabetes:              >6.4% Glycemic control for   <7.0% adults with diabetes    Mean Plasma Glucose 105.41 mg/dL    Comment: Performed at [facility], [address], [place], [place] [address]  Lipid panel     Status: Abnormal   Collection  Time: 09/28/18  3:36 PM  Result Value Ref Range   Cholesterol 193 0 - 200 mg/dL   Triglycerides 66 <191 mg/dL   HDL 43 >47 mg/dL   Total CHOL/HDL Ratio 4.5 RATIO   VLDL 13 0 - 40 mg/dL   LDL Cholesterol 829 (H) 0 - 99 mg/dL    Comment:        Total Cholesterol/HDL:CHD Risk Coronary Heart Disease Risk Table                     Men   Women  1/2 Average Risk   3.4   3.3  Average Risk       5.0   4.4  2 X Average Risk   9.6   7.1  3 X Average Risk  23.4   11.0        Use the calculated Patient Ratio above and the CHD Risk Table to determine the patient's CHD Risk.  ATP III CLASSIFICATION (LDL):  <100     mg/dL   Optimal  161-096  mg/dL   Near or Above                    Optimal  130-159  mg/dL   Borderline  045-409  mg/dL   High  >811     mg/dL   Very High Performed at [facility], [address] [place], [place] [address]   TSH     Status: None   Collection Time: 09/28/18  3:36 PM  Result Value Ref Range   TSH 1.025 0.350 - 4.500 uIU/mL    Comment: Performed by a 3rd Generation assay with a functional sensitivity of <=0.01 uIU/mL. Performed at [facility], [address] [place], [place] [address]     Blood Alcohol level:  Lab Results  Component Value Date   [facility] <10 09/28/2018    Metabolic Disorder Labs:  Lab Results  Component Value Date   HGBA1C 5.3 09/28/2018   MPG 105.41 09/28/2018   No results found for: PROLACTIN Lab Results  Component Value Date   CHOL 193 09/28/2018   TRIG 66 09/28/2018   HDL 43 09/28/2018   CHOLHDL 4.5 09/28/2018   VLDL 13 09/28/2018   LDLCALC 137 (H) 09/28/2018    Current Medications: Current Facility-Administered Medications  Medication Dose Route Frequency Provider Last Rate Last Dose  . acetaminophen (TYLENOL) tablet 650 mg  650 mg Oral Q6H PRN Clapacs, John T, MD      . alum & mag hydroxide-simeth (MAALOX/MYLANTA) 200-200-20 MG/5ML suspension 30 mL  30 mL Oral Q4H PRN Clapacs, John T, MD       . ARIPiprazole (ABILIFY) tablet 10 mg  10 mg Oral Daily Mikie Misner B, MD      . carbamazepine (TEGRETOL) tablet 200 mg  200 mg Oral BID AC & HS Dustyn Armbrister B, MD      . hydrOXYzine (ATARAX/VISTARIL) tablet 50 mg  50 mg Oral TID PRN Clapacs, Jackquline Denmark, MD   50 mg at 09/28/18 2350  . magnesium hydroxide (MILK OF MAGNESIA) suspension 30 mL  30 mL Oral Daily PRN Clapacs, John T, MD      . nicotine (NICODERM CQ - dosed in mg/24 hours) patch 21 mg  21 mg Transdermal Daily Breena Bevacqua B, MD      . zolpidem (AMBIEN) tablet 10 mg  10 mg Oral QHS Juniel Groene B, MD       PTA Medications: No medications prior to admission.    Musculoskeletal: Strength & Muscle Tone: within normal limits Gait & Station: normal Patient leans: N/A  Psychiatric Specialty Exam: I reviewed physical exam performed in the ER and agree with thefindings. Physical Exam  Nursing note and vitals reviewed. Psychiatric: His speech is rapid and/or pressured. He is hyperactive. Thought content is paranoid. Cognition and memory are normal. He expresses impulsivity. He exhibits a depressed mood. He expresses suicidal ideation. He expresses suicidal plans.    Review of Systems  Neurological: Negative.   Psychiatric/Behavioral: Positive for depression and suicidal ideas. The patient has insomnia.   All other systems reviewed and are negative.   Blood pressure 113/62, pulse 85, temperature 97.7 F (36.5 C), temperature source Oral, resp. rate 16, height 5\' 11"  (1.803 m), weight 90.7 kg, SpO2 99 %.Body mass index is 27.89 kg/m.  See SRA  Sleep:  Number of Hours: 5    Treatment Plan Summary: Daily contact with patient to assess and evaluate symptoms and progress in treatment and Medication management   Mr, Kepner is a 47 year old male with a history of bipolar depression admitted for suicidal ideation with a plan.  #Suicidal  ideation -patient is able to contract for safaty in the hospital  #Mood -start Abilify 10 mg daily  #Insomnia Ambien 10 mg nightly  #Cocaine use -denies problems, declines treatment  #Smoking cessation -nicotine patch is available  #Labs -lipid panet. TSH, A1C -EKG  #Disposition -discharge to home -follow up with [facility]   Observation Level/Precautions:  15 minute checks  Laboratory:  CBC Chemistry Profile UDS UA  Psychotherapy:    Medications:    Consultations:    Discharge Concerns:    Estimated LOS:  Other:     Physician Treatment Plan for Primary Diagnosis: Bipolar I disorder, most recent episode depressed, severe without psychotic features (HCC) Long Term Goal(s): Improvement in symptoms so as ready for discharge  Short Term Goals: Ability to identify changes in lifestyle to reduce recurrence of condition will improve, Ability to verbalize feelings will improve, Ability to disclose and discuss suicidal ideas, Ability to demonstrate self-control will improve, Ability to identify and develop effective coping behaviors will improve, Ability to maintain clinical measurements within normal limits will improve, Compliance with prescribed medications will improve and Ability to identify triggers associated with substance abuse/mental health issues will improve  Physician Treatment Plan for Secondary Diagnosis: Principal Problem:   Bipolar I disorder, most recent episode depressed, severe without psychotic features (HCC) Active Problems:   Suicidal ideation   Tobacco use disorder  Long Term Goal(s): Improvement in symptoms so as ready for discharge  Short Term Goals: Ability to identify changes in lifestyle to reduce recurrence of condition will improve, Ability to demonstrate self-control will improve and Ability to identify triggers associated with substance abuse/mental health issues will improve  I certify that inpatient services furnished can reasonably be  expected to improve the patient's condition.    Kristine Linea, MD 11/5/201912:30 PM

## 2018-09-29 NOTE — [facility] Suicide Risk Assessment (Signed)
[facility] Admission Suicide Risk Assessment   Nursing information obtained from:  Patient, Review of record Demographic factors:  Male, Caucasian Current Mental Status:  Self-harm thoughts, Suicidal ideation indicated by patient Loss Factors:  NA Historical Factors:  Prior suicide attempts Risk Reduction Factors:  Responsible for children under 46 years of age, Sense of responsibility to family, Religious beliefs about death, Employed, Living with another person, especially a relative, Positive social support, Positive therapeutic relationship, Positive coping skills or problem solving skills  Total Time spent with patient: 1 hour Principal Problem: Bipolar I disorder, most recent episode depressed, severe without psychotic features (HCC) Diagnosis:   Patient Active Problem List   Diagnosis Date Noted  . Bipolar I disorder, most recent episode depressed, severe without psychotic features (HCC) [F31.4] 09/28/2018    Priority: High  . Tobacco use disorder [F17.200] 09/29/2018  . Cocaine use disorder, moderate, dependence (HCC) [F14.20] 09/28/2018  . Suicidal ideation [R45.851] 09/28/2018   Subjective Data: suicidal ideation  Continued Clinical Symptoms:  Alcohol Use Disorder Identification Test Final Score (AUDIT): 0 The "Alcohol Use Disorders Identification Test", Guidelines for Use in Primary Care, Second Edition.  World Science writer [facility]). Score between 0-7:  no or low risk or alcohol related problems. Score between 8-15:  moderate risk of alcohol related problems. Score between 16-19:  high risk of alcohol related problems. Score 20 or above:  warrants further diagnostic evaluation for alcohol dependence and treatment.   CLINICAL FACTORS:   Bipolar Disorder:   Depressive phase Depression:   Impulsivity   Musculoskeletal: Strength & Muscle Tone: within normal limits Gait & Station: normal Patient leans: N/A  Psychiatric Specialty Exam: Physical Exam  ROS  Blood pressure  113/62, pulse 85, temperature 97.7 F (36.5 C), temperature source Oral, resp. rate 16, height 5\' 11"  (1.803 m), weight 90.7 kg, SpO2 99 %.Body mass index is 27.89 kg/m.  General Appearance: Casual  Eye Contact:  Fair  Speech:  Pressured  Volume:  Decreased  Mood:  Depressed  Affect:  Blunt  Thought Process:  Goal Directed and Descriptions of Associations: Intact  Orientation:  Full (Time, Place, and Person)  Thought Content:  WDL  Suicidal Thoughts:  Yes.  without intent/plan  Homicidal Thoughts:  No  Memory:  Immediate;   Fair Recent;   Fair Remote;   Fair  Judgement:  Impaired  Insight:  Shallow  Psychomotor Activity:  Psychomotor Retardation  Concentration:  Concentration: Fair and Attention Span: Fair  Recall:  [employer] of Knowledge:  Fair  Language:  Fair  Akathisia:  No  Handed:  Right  AIMS (if indicated):     Assets:  Communication Skills Desire for Improvement Financial Resources/Insurance Housing Intimacy Physical Health Resilience Social Support Vocational/Educational  ADL's:  Intact  Cognition:  WNL  Sleep:  Number of Hours: 5      COGNITIVE FEATURES THAT CONTRIBUTE TO RISK:  None    SUICIDE RISK:   Severe:  Frequent, intense, and enduring suicidal ideation, specific plan, no subjective intent, but some objective markers of intent (i.e., choice of lethal method), the method is accessible, some limited preparatory behavior, evidence of impaired self-control, severe dysphoria/symptomatology, multiple risk factors present, and few if any protective factors, particularly a lack of social support.  PLAN OF CARE: hospital admission, medication management, substance abuse counseling, discharge planning.  Mr, Messer is a 47 year old male with a history of bipolar depression admitted for suicidal ideation with a plan.  #Suicidal ideation -patient is able to contract for  safaty in the hospital  #Mood -start Abilify 10 mg daily  #Insomnia Ambien 10 mg  nightly  #Smoking cessation -nicotine patch is available  #Labs -lipid panet. TSH, A1C -EKG  #Disposition -discharge to home -follow up with [facility]  I certify that inpatient services furnished can reasonably be expected to improve the patient's condition.   Kristine Linea, MD 09/29/2018, 8:20 AM

## 2018-09-29 NOTE — Progress Notes (Signed)
Received Nicholas Horne this Am in his room asleep. He continued to sleep through breakfast and lunch. He was medicated per order without incident. His affect is flat with few words.  He endorsed feeling anxious, depressed and hopelessness.

## 2018-09-29 NOTE — Progress Notes (Signed)
Recreation Therapy Notes  INPATIENT RECREATION THERAPY ASSESSMENT  Patient Details Name: Nicholas Horne MRN: [id number] DOB: [birth year] Today's Date: 09/29/2018       Information Obtained From: Patient  Able to Participate in Assessment/Interview: Yes  Patient Presentation: Responsive  Reason for Admission (Per Patient): Other (Comments), Active Symptoms(Depressions)  Patient Stressors:    Coping Skills:   Exercise  Leisure Interests (2+):  Exercise - Paediatric nurse, Exercise - Running  Frequency of Recreation/Participation: Weekly  Awareness of Community Resources:  Yes  Community Resources:  Gym  Current Use: Yes  If no, Barriers?:    Expressed Interest in [employer] Information:    [place] of Residence:  [place]  Patient Main Form of Transportation: Set designer  Patient Strengths:  I work hard  Patient Identified Areas of Improvement:  Do not know  Patient Goal for Hospitalization:  Get on right medication  Current SI (including self-harm):  No  Current HI:  No  Current AVH: No  Staff Intervention Plan: Group Attendance, Collaborate with Interdisciplinary Treatment Team  Consent to Intern Participation: N/A  Rejeana Fadness 09/29/2018, 2:58 PM

## 2018-09-29 NOTE — Progress Notes (Signed)
[facility] MD Progress Note  09/30/2018 2:04 PM CLOYCE BLANKENHORN  MRN:  [id number]  Subjective:   Mr. Wesson met with treatment team this morning. Still deperssed, suicidal and very "tired". He refuses medications except for Abilify.   Principal Problem: Bipolar I disorder, most recent episode depressed, severe without psychotic features ([place]) Diagnosis:   Patient Active Problem List   Diagnosis Date Noted  . Bipolar I disorder, most recent episode depressed, severe without psychotic features ([place]) [F31.4] 09/28/2018    Priority: High  . Tobacco use disorder [F17.200] 09/29/2018  . Cocaine use disorder, moderate, dependence ([place]) [F14.20] 09/28/2018  . Suicidal ideation [R45.851] 09/28/2018   Total Time spent with patient: 20 minutes  Past Psychiatric History: bipolar disorder  Past Medical History: History reviewed. No pertinent past medical history. History reviewed. No pertinent surgical history. Family History: History reviewed. No pertinent family history. Family Psychiatric  History: none Social History:  Social History   Substance and Sexual Activity  Alcohol Use No     Social History   Substance and Sexual Activity  Drug Use Yes  . Types: Cocaine    Social History   Socioeconomic History  . Marital status: Divorced    Spouse name: Not on file  . Number of children: Not on file  . Years of education: Not on file  . Highest education level: Not on file  Occupational History  . Not on file  Social Needs  . Financial resource strain: Not on file  . Food insecurity:    Worry: Not on file    Inability: Not on file  . Transportation needs:    Medical: Not on file    Non-medical: Not on file  Tobacco Use  . Smoking status: Current Every Day Smoker  . Smokeless tobacco: Never Used  Substance and Sexual Activity  . Alcohol use: No  . Drug use: Yes    Types: Cocaine  . Sexual activity: Not on file  Lifestyle  . Physical activity:    Days per week: Not on file     Minutes per session: Not on file  . Stress: Not on file  Relationships  . Social connections:    Talks on phone: Not on file    Gets together: Not on file    Attends religious service: Not on file    Active member of club or organization: Not on file    Attends meetings of clubs or organizations: Not on file    Relationship status: Not on file  Other Topics Concern  . Not on file  Social History Narrative  . Not on file   Additional Social History:                         Sleep: Poor  Appetite:  Fair  Current Medications: Current Facility-Administered Medications  Medication Dose Route Frequency Provider Last Rate Last Dose  . acetaminophen (TYLENOL) tablet 650 mg  650 mg Oral Q6H PRN Clapacs, John T, MD      . alum & mag hydroxide-simeth (MAALOX/MYLANTA) 200-200-20 MG/5ML suspension 30 mL  30 mL Oral Q4H PRN Clapacs, Madie Reno, MD      . Derrill Memo ON 10/01/2018] ARIPiprazole (ABILIFY) tablet 20 mg  20 mg Oral Daily Kayce Betty B, MD      . carbamazepine (TEGRETOL) tablet 200 mg  200 mg Oral BID AC & HS Myking Sar B, MD      . magnesium hydroxide (MILK OF MAGNESIA)  suspension 30 mL  30 mL Oral Daily PRN Clapacs, John T, MD      . nicotine (NICODERM CQ - dosed in mg/24 hours) patch 21 mg  21 mg Transdermal Daily Tavia Stave B, MD      . zolpidem (AMBIEN) tablet 10 mg  10 mg Oral QHS PRN Kodee Ravert B, MD        Lab Results:  Results for orders placed or performed during the hospital encounter of 09/28/18 (from the past 48 hour(s))  Hemoglobin A1c     Status: None   Collection Time: 09/28/18  3:36 PM  Result Value Ref Range   Hgb A1c MFr Bld 5.3 4.8 - 5.6 %    Comment: (NOTE) Pre diabetes:          5.7%-6.4% Diabetes:              >6.4% Glycemic control for   <7.0% adults with diabetes    Mean Plasma Glucose 105.41 mg/dL    Comment: Performed at [facility], [place] [address], [place], [place] [address]  Lipid panel     Status:  Abnormal   Collection Time: 09/28/18  3:36 PM  Result Value Ref Range   Cholesterol 193 0 - 200 mg/dL   Triglycerides 66 <150 mg/dL   HDL 43 >40 mg/dL   Total CHOL/HDL Ratio 4.5 RATIO   VLDL 13 0 - 40 mg/dL   LDL Cholesterol 137 (H) 0 - 99 mg/dL    Comment:        Total Cholesterol/HDL:CHD Risk Coronary Heart Disease Risk Table                     Men   Women  1/2 Average Risk   3.4   3.3  Average Risk       5.0   4.4  2 X Average Risk   9.6   7.1  3 X Average Risk  23.4   11.0        Use the calculated Patient Ratio above and the CHD Risk Table to determine the patient's CHD Risk.        ATP III CLASSIFICATION (LDL):  <100     mg/dL   Optimal  100-129  mg/dL   Near or Above                    Optimal  130-159  mg/dL   Borderline  160-189  mg/dL   High  >190     mg/dL   Very High Performed at [facility], [address] [place], [place] [address]   TSH     Status: None   Collection Time: 09/28/18  3:36 PM  Result Value Ref Range   TSH 1.025 0.350 - 4.500 uIU/mL    Comment: Performed by a 3rd Generation assay with a functional sensitivity of <=0.01 uIU/mL. Performed at [facility], [address] [place], [place] [address]     Blood Alcohol level:  Lab Results  Component Value Date   [facility] <10 14/48/1856    Metabolic Disorder Labs: Lab Results  Component Value Date   HGBA1C 5.3 09/28/2018   MPG 105.41 09/28/2018   No results found for: PROLACTIN Lab Results  Component Value Date   CHOL 193 09/28/2018   TRIG 66 09/28/2018   HDL 43 09/28/2018   CHOLHDL 4.5 09/28/2018   VLDL 13 09/28/2018   LDLCALC 137 (H) 09/28/2018    Physical  Findings: AIMS:  , ,  ,  ,    CIWA:    COWS:     Musculoskeletal: Strength & Muscle Tone: within normal limits Gait & Station: normal Patient leans: N/A  Psychiatric Specialty Exam: Physical Exam  Nursing note and vitals reviewed. Psychiatric: His speech is normal. His affect is blunt. He is  withdrawn. Thought content is paranoid. Cognition and memory are normal. He expresses impulsivity. He exhibits a depressed mood. He expresses suicidal ideation. He expresses suicidal plans.    Review of Systems  Neurological: Negative.   Psychiatric/Behavioral: Positive for depression, substance abuse and suicidal ideas.  All other systems reviewed and are negative.   Blood pressure 132/80, pulse 86, temperature 97.8 F (36.6 C), temperature source Oral, resp. rate 18, height '5\' 11"'  (1.803 m), weight 90.7 kg, SpO2 99 %.Body mass index is 27.89 kg/m.  General Appearance: Casual  Eye Contact:  Good  Speech:  Pressured  Volume:  Normal  Mood:  Depressed and Hopeless  Affect:  Flat  Thought Process:  Goal Directed and Descriptions of Associations: Intact  Orientation:  Full (Time, Place, and Person)  Thought Content:  Paranoid Ideation  Suicidal Thoughts:  Yes.  with intent/plan  Homicidal Thoughts:  No  Memory:  Immediate;   Fair Recent;   Fair Remote;   Fair  Judgement:  Poor  Insight:  Shallow  Psychomotor Activity:  Psychomotor Retardation  Concentration:  Concentration: Fair and Attention Span: Fair  Recall:  [employer] of Knowledge:  Fair  Language:  Fair  Akathisia:  No  Handed:  Right  AIMS (if indicated):     Assets:  Communication Skills Desire for Improvement Housing Intimacy Physical Health Resilience Social Support Transportation Vocational/Educational  ADL's:  Intact  Cognition:  WNL  Sleep:  Number of Hours: 5     Treatment Plan Summary: Daily contact with patient to assess and evaluate symptoms and progress in treatment and Medication management   Mr, Wolin is a 47 year old male with a history of bipolar depression admitted for suicidal ideation with a plan.  #Suicidal ideation -patient is able to contract for safaty in the hospital  #Mood -increase Abilify 20 mg daily -refused Tegretol 200 mg BID  #Insomnia Ambien 10 mg nightly  PRN  #Cocaine use -denies problems, declines treatment  #Smoking cessation -nicotine patch is available  #Labs -lipid panet. TSH, A1C are normal -EKG  #Disposition -discharge to home -follow up with Ashok Croon, MD 09/30/2018, 2:04 PM

## 2018-09-29 NOTE — Progress Notes (Signed)
Recreation Therapy Notes  Date: 09/29/2018  Time: 9:30 am   Location: Craft room   Behavioral response: N/A   Intervention Topic: Goals  Discussion/Intervention: Patient did not attend group.   Clinical Observations/Feedback:  Patient did not attend group.   Fionn Stracke LRT/CTRS        Criss Bartles 09/29/2018 11:10 AM

## 2018-09-29 NOTE — Progress Notes (Signed)
Precautionary checks every 15 minutes for safety maintained, room free of safety hazards, patient sustains no injury or falls during this shift. Will endorse care to next shift.   

## 2018-09-29 NOTE — [facility] Suicide Risk Assessment (Signed)
[facility] INPATIENT:  Family/Significant Other Suicide Prevention Education  Suicide Prevention Education:  Patient Refusal for Family/Significant Other Suicide Prevention Education: The patient Nicholas Horne has refused to provide written consent for family/significant other to be provided Family/Significant Other Suicide Prevention Education during admission and/or prior to discharge.  Physician notified.  Johny Shears 09/29/2018, 11:43 AM

## 2018-09-29 NOTE — Plan of Care (Signed)
Patient has just been admitted to the unit and has not had sufficient time to show progressions. Will continue to monitor for progressions with the implementation of care planning.    Problem: Education: Goal: Emotional status will improve Outcome: Not Progressing Goal: Mental status will improve Outcome: Not Progressing   Problem: Safety: Goal: Periods of time without injury will increase Outcome: Not Progressing   Problem: Coping: Goal: Coping ability will improve Outcome: Not Progressing   Problem: Safety: Goal: Ability to disclose and discuss suicidal ideas will improve Outcome: Not Progressing   Problem: Health Behavior/Discharge Planning: Goal: Ability to identify changes in lifestyle to reduce recurrence of condition will improve Outcome: Not Progressing   Problem: Safety: Goal: Ability to remain free from injury will improve Outcome: Not Progressing

## 2018-09-29 NOTE — Tx Team (Signed)
Initial Treatment Plan 09/29/2018 12:35 AM Nicholas Horne ZOX:[id number]    PATIENT STRESSORS: Marital or family conflict Substance abuse Other: Depression, Suicidal thoughts   PATIENT STRENGTHS: Ability for insight Active sense of humor Average or above average intelligence Capable of independent living Metallurgist fund of knowledge Motivation for treatment/growth Physical Health Religious Affiliation Special hobby/interest Supportive family/friends Work skills   PATIENT IDENTIFIED PROBLEMS: Suicidal thoughts  Substance Abuse  Depression  Anxiety               DISCHARGE CRITERIA:  Improved stabilization in mood, thinking, and/or behavior Motivation to continue treatment in a less acute level of care Need for constant or close observation no longer present Verbal commitment to aftercare and medication compliance  PRELIMINARY DISCHARGE PLAN: Attend PHP/IOP Outpatient therapy Participate in family therapy Return to previous living arrangement Return to previous work or school arrangements  PATIENT/FAMILY INVOLVEMENT: This treatment plan has been presented to and reviewed with the patient, Nicholas Horne.  The patient and has been given the opportunity to ask questions and make suggestions.  [name], RN 09/29/2018, 12:35 AM
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
## 2018-09-29 NOTE — [facility] Group Notes (Signed)
[facility] Group Notes:  (Nursing/MHT/Case Management/Adjunct)  Date:  09/29/2018  Time:  9:43 AM  Type of Therapy:  Psychoeducational Skills  Participation Level:  Did Not Attend  Nicholas Horne 09/29/2018, 9:43 AM

## 2018-09-29 NOTE — Progress Notes (Signed)
D: Received patient from [facility] Emergency Department. Patient skin assessment completed with Kori, BHT, skin is intact, no contraband found. Pt. Was admitted under the services of, Dr. Jennet Maduro.   Patient during the admissions process is noticeably restless and anxious reported by the patient from, "doing cocaine". Pt. Reports that this is the first time he has done cocaine in a long time, "about 7 years", because has been feeling so down and wanted to, "I guess just get away from all of this feeling", referring to his depression he states. Pt. Reports during our conversation he has been working, "like crazy" and pushing his depression that has been getting worse and worse to the side and he finally reportedly, "broke down" and "I hung myself". Pt. Friend reportedly caught him in the process and convinced him to come to the hospital to get help. Pt. Denies HI/AVH, but actively reports suicidal thoughts stating, "I just constantly have them lately". Pt. Verbally is able to contract for safety and the patient verbalizes he would come to this writer if the thoughts became more severe. Pt. Was able to be complaint with the admissions process and after our conversations this writer gave the patient oral PRN medications to reduce anxieties and comfort the patient that had good effect. Pt. Also given meal tray and fluids to drink.    A: Patient oriented to unit/room/call light. Patient was encourage to participate in unit activities and continue with plan of care. Q x 15 minute observation checks were initiated for safety. Pt. Given extensive admissions education.   R: Patient is receptive to treatment plan created with patient direct input and safety plan put into place for safety. Will continue to monitor the patient for progressions.

## 2018-09-30 LAB — COMPREHENSIVE METABOLIC PANEL
ALT: 38 U/L (ref 0–44)
ANION GAP: 8 (ref 5–15)
AST: 30 U/L (ref 15–41)
Albumin: 3.7 g/dL (ref 3.5–5.0)
Alkaline Phosphatase: 51 U/L (ref 38–126)
BILIRUBIN TOTAL: 0.7 mg/dL (ref 0.3–1.2)
BUN: 12 mg/dL (ref 6–20)
CHLORIDE: 105 mmol/L (ref 98–111)
CO2: 27 mmol/L (ref 22–32)
Calcium: 9.3 mg/dL (ref 8.9–10.3)
Creatinine, Ser: 1.06 mg/dL (ref 0.61–1.24)
GFR calc Af Amer: >60 mL/min (ref >60)
Glucose, Bld: 77 mg/dL (ref 70–99)
Potassium: 3.9 mmol/L (ref 3.5–5.1)
Sodium: 140 mmol/L (ref 135–145)
Total Protein: 7.1 g/dL (ref 6.5–8.1)

## 2018-09-30 LAB — CBC
HEMATOCRIT: 48 % (ref 39.0–52.0)
Hemoglobin: 15.9 g/dL (ref 13.0–17.0)
MCH: 30.6 pg (ref 26.0–34.0)
MCHC: 33.1 g/dL (ref 30.0–36.0)
MCV: 92.3 fL (ref 80.0–100.0)
PLATELETS: 228 10*3/uL (ref 150–400)
RBC: 5.2 MIL/uL (ref 4.22–5.81)
RDW: 13.6 % (ref 11.5–15.5)
WBC: 12.6 10*3/uL — ABNORMAL HIGH (ref 4.0–10.5)
nRBC: 0 % (ref 0.0–0.2)

## 2018-09-30 MED ORDER — ZOLPIDEM TARTRATE 5 MG PO TABS
10.0000 mg | ORAL_TABLET | Freq: Every evening | ORAL | Status: DC | PRN
  Administered 2018-10-01: 10 mg via ORAL
  Filled 2018-09-30: qty 2

## 2018-09-30 MED ORDER — ARIPIPRAZOLE 10 MG PO TABS
20.0000 mg | ORAL_TABLET | Freq: Every day | ORAL | Status: DC
  Administered 2018-10-01 – 2018-10-02 (×2): 20 mg via ORAL
  Filled 2018-09-30 (×2): qty 2

## 2018-09-30 MED ORDER — NICOTINE 21 MG/24HR TD PT24
21.0000 mg | MEDICATED_PATCH | Freq: Once | TRANSDERMAL | Status: AC
  Administered 2018-09-30: 21 mg via TRANSDERMAL
  Filled 2018-09-30: qty 1

## 2018-09-30 NOTE — Tx Team (Addendum)
Interdisciplinary Treatment and Diagnostic Plan Update  09/30/2018 Time of Session: 1030am MALEEK CRAVER MRN: [id number]  Principal Diagnosis: Bipolar I disorder, most recent episode depressed, severe without psychotic features (HCC)  Secondary Diagnoses: Principal Problem:   Bipolar I disorder, most recent episode depressed, severe without psychotic features (HCC) Active Problems:   Suicidal ideation   Tobacco use disorder   Current Medications:  Current Facility-Administered Medications  Medication Dose Route Frequency Provider Last Rate Last Dose  . acetaminophen (TYLENOL) tablet 650 mg  650 mg Oral Q6H PRN Clapacs, John T, MD      . alum & mag hydroxide-simeth (MAALOX/MYLANTA) 200-200-20 MG/5ML suspension 30 mL  30 mL Oral Q4H PRN Clapacs, Jackquline Denmark, MD      . Melene Muller ON 10/01/2018] ARIPiprazole (ABILIFY) tablet 20 mg  20 mg Oral Daily Pucilowska, Jolanta B, MD      . carbamazepine (TEGRETOL) tablet 200 mg  200 mg Oral BID AC & HS Pucilowska, Jolanta B, MD      . magnesium hydroxide (MILK OF MAGNESIA) suspension 30 mL  30 mL Oral Daily PRN Clapacs, John T, MD      . nicotine (NICODERM CQ - dosed in mg/24 hours) patch 21 mg  21 mg Transdermal Daily Pucilowska, Jolanta B, MD      . zolpidem (AMBIEN) tablet 10 mg  10 mg Oral QHS PRN Pucilowska, Jolanta B, MD       PTA Medications: No medications prior to admission.    Patient Stressors: Marital or family conflict Substance abuse Other: Depression, Suicidal thoughts  Patient Strengths: Ability for insight Active sense of humor Average or above average intelligence Capable of independent living Metallurgist fund of knowledge Motivation for treatment/growth Physical Health Religious Affiliation Special hobby/interest Supportive family/friends Work skills  Treatment Modalities: Medication Management, Group therapy, Case management,  1 to 1 session with clinician, Psychoeducation, Recreational  therapy.   Physician Treatment Plan for Primary Diagnosis: Bipolar I disorder, most recent episode depressed, severe without psychotic features (HCC) Long Term Goal(s): Improvement in symptoms so as ready for discharge Improvement in symptoms so as ready for discharge   Short Term Goals: Ability to identify changes in lifestyle to reduce recurrence of condition will improve Ability to verbalize feelings will improve Ability to disclose and discuss suicidal ideas Ability to demonstrate self-control will improve Ability to identify and develop effective coping behaviors will improve Ability to maintain clinical measurements within normal limits will improve Compliance with prescribed medications will improve Ability to identify triggers associated with substance abuse/mental health issues will improve Ability to identify changes in lifestyle to reduce recurrence of condition will improve Ability to demonstrate self-control will improve Ability to identify triggers associated with substance abuse/mental health issues will improve  Medication Management: Evaluate patient's response, side effects, and tolerance of medication regimen.  Therapeutic Interventions: 1 to 1 sessions, Unit Group sessions and Medication administration.  Evaluation of Outcomes: Progressing  Physician Treatment Plan for Secondary Diagnosis: Principal Problem:   Bipolar I disorder, most recent episode depressed, severe without psychotic features (HCC) Active Problems:   Suicidal ideation   Tobacco use disorder  Long Term Goal(s): Improvement in symptoms so as ready for discharge Improvement in symptoms so as ready for discharge   Short Term Goals: Ability to identify changes in lifestyle to reduce recurrence of condition will improve Ability to verbalize feelings will improve Ability to disclose and discuss suicidal ideas Ability to demonstrate self-control will improve Ability to identify and develop  effective  coping behaviors will improve Ability to maintain clinical measurements within normal limits will improve Compliance with prescribed medications will improve Ability to identify triggers associated with substance abuse/mental health issues will improve Ability to identify changes in lifestyle to reduce recurrence of condition will improve Ability to demonstrate self-control will improve Ability to identify triggers associated with substance abuse/mental health issues will improve     Medication Management: Evaluate patient's response, side effects, and tolerance of medication regimen.  Therapeutic Interventions: 1 to 1 sessions, Unit Group sessions and Medication administration.  Evaluation of Outcomes: Progressing   RN Treatment Plan for Primary Diagnosis: Bipolar I disorder, most recent episode depressed, severe without psychotic features (HCC) Long Term Goal(s): Knowledge of disease and therapeutic regimen to maintain health will improve  Short Term Goals: Ability to verbalize feelings will improve, Ability to identify and develop effective coping behaviors will improve and Compliance with prescribed medications will improve  Medication Management: RN will administer medications as ordered by provider, will assess and evaluate patient's response and provide education to patient for prescribed medication. RN will report any adverse and/or side effects to prescribing provider.  Therapeutic Interventions: 1 on 1 counseling sessions, Psychoeducation, Medication administration, Evaluate responses to treatment, Monitor vital signs and CBGs as ordered, Perform/monitor CIWA, COWS, AIMS and Fall Risk screenings as ordered, Perform wound care treatments as ordered.  Evaluation of Outcomes: Progressing   LCSW Treatment Plan for Primary Diagnosis: Bipolar I disorder, most recent episode depressed, severe without psychotic features (HCC) Long Term Goal(s): Safe transition to appropriate next level  of care at discharge, Engage patient in therapeutic group addressing interpersonal concerns.  Short Term Goals: Engage patient in aftercare planning with referrals and resources, Increase ability to appropriately verbalize feelings, Increase emotional regulation, Facilitate acceptance of mental health diagnosis and concerns, Identify triggers associated with mental health/substance abuse issues and Increase skills for wellness and recovery  Therapeutic Interventions: Assess for all discharge needs, 1 to 1 time with Social worker, Explore available resources and support systems, Assess for adequacy in community support network, Educate family and significant other(s) on suicide prevention, Complete Psychosocial Assessment, Interpersonal group therapy.  Evaluation of Outcomes: Progressing   Progress in Treatment: Attending groups: No. Participating in groups: No. Taking medication as prescribed: No. Toleration medication: Yes. Family/Significant other contact made: No, will contact:  Pt refused Patient understands diagnosis: Yes. Discussing patient identified problems/goals with staff: Yes. Medical problems stabilized or resolved: Yes. Denies suicidal/homicidal ideation: Yes. Issues/concerns per patient self-inventory: No. Other: NA  New problem(s) identified: No, Describe:  "Get my medicine and try to stay alive"  New Short Term/Long Term Goal(s):"Get my medicine and try to stay alive  Patient Goals:  "Get my medicine and try to stay alive  Discharge Plan or Barriers: Pt will return home and follow up with outpatient treatment  Reason for Continuation of Hospitalization: Medication stabilization  Estimated Length of Stay: 5-7 days  Recreational Therapy: Patient Stressors: N/A  Patient Goal: Patient will identify 3 positive coping skills strategies to use post d/c within 5 recreation therapy group sessions  Attendees: Patient:Josia Azimi 09/30/2018 12:02 PM  Physician: Dr.  Jennet Maduro 09/30/2018 12:02 PM  Nursing: Bruce Donath, RN 09/30/2018 12:02 PM  RN Care Manager: 09/30/2018 12:02 PM  Social Worker: Johny Shears, LCSW 09/30/2018 12:02 PM  Recreational Therapist: Danella Deis. Dreama Saa, LRT 09/30/2018 12:02 PM  Other: Lowella Dandy, LCSW 09/30/2018 12:02 PM  Other:  09/30/2018 12:02 PM  Other: 09/30/2018 12:02 PM    Scribe  for Treatment Team: Suzan Slick, LCSW 09/30/2018 12:02 PM

## 2018-09-30 NOTE — Plan of Care (Addendum)
Patient found asleep in bed upon my arrival. Patient is isolative to bed throughout the evening. Neither visible nor social. Minimally verbal for assessment. Denies SI/HI/AVH. Continues to refuse Tegretol. Denies pain. Eats adequate snack and returned to bed. Q 15 minute checks maintained. Will continue to monitor throughout the shift. Patient slept 7 hours. No apparent distress. Will endorse care to oncoming shift.  Problem: Education: Goal: Emotional status will improve Outcome: Not Progressing Goal: Mental status will improve Outcome: Not Progressing   Problem: Coping: Goal: Coping ability will improve Outcome: Not Progressing

## 2018-09-30 NOTE — Plan of Care (Signed)
Patient is A&O x 4. Affect is flat with a sad affect, minimal interaction with peers noted. Out of room for meals and medications only, patient otherwise in his room resting with his eyes closed. Denies passive SI, HI, AVH and pain at this time. Reports sleeping fair last night without the use of sleep medication. Reports poor energy level and rates his concentration as poor. Milieu remains safe with q 15 minute safety checks. Will continue to monitor.

## 2018-09-30 NOTE — Progress Notes (Signed)
Recreation Therapy Notes   Date: 09/30/2018  Time: 9:30 am   Location: Craft room   Behavioral response: N/A   Intervention Topic: Self-esteem  Discussion/Intervention: Patient did not attend group.   Clinical Observations/Feedback:  Patient did not attend group.   Rohen Kimes LRT/CTRS        Macarius Ruark 09/30/2018 11:33 AM

## 2018-09-30 NOTE — Plan of Care (Signed)
Patient is calm and resting without  issues , refused his PM medicines , without any reason , patient states, ' I don't want to take those things" a note was passed to Dr, Wynn Banker. Patient is able to make life style changes and denies Si/HI and AVH, sleep long hours and contract for safety no distress.   Problem: Education: Goal: Emotional status will improve Outcome: Progressing Goal: Mental status will improve Outcome: Progressing   Problem: Safety: Goal: Periods of time without injury will increase Outcome: Progressing   Problem: Coping: Goal: Coping ability will improve Outcome: Progressing   Problem: Safety: Goal: Ability to disclose and discuss suicidal ideas will improve Outcome: Progressing   Problem: Health Behavior/Discharge Planning: Goal: Ability to identify changes in lifestyle to reduce recurrence of condition will improve Outcome: Progressing   Problem: Safety: Goal: Ability to remain free from injury will improve Outcome: Progressing   Problem: Safety: Goal: Ability to remain free from injury will improve Outcome: Progressing

## 2018-09-30 NOTE — Progress Notes (Signed)
[facility] MD Progress Note  10/01/2018 2:35 PM Nicholas Horne  MRN:  [id number]  Subjective:    Nicholas Horne feels slightly better today in terms of his depression. He is up this morning and more able to communicate. He still has passing thoughts of suicide but his affect is brighter.  He was unable to sleep due to severe cough. VS are stable.  Principal Problem: Bipolar I disorder, most recent episode depressed, severe without psychotic features ([place]) Diagnosis:   Patient Active Problem List   Diagnosis Date Noted  . Bipolar I disorder, most recent episode depressed, severe without psychotic features ([place]) [F31.4] 09/28/2018    Priority: High  . Tobacco use disorder [F17.200] 09/29/2018  . Cocaine use disorder, moderate, dependence ([place]) [F14.20] 09/28/2018  . Suicidal ideation [R45.851] 09/28/2018   Total Time spent with patient: 20 minutes  Past Psychiatric History: bipoar  Past Medical History: History reviewed. No pertinent past medical history. History reviewed. No pertinent surgical history. Family History: History reviewed. No pertinent family history. Family Psychiatric  History: none Social History:  Social History   Substance and Sexual Activity  Alcohol Use No     Social History   Substance and Sexual Activity  Drug Use Yes  . Types: Cocaine    Social History   Socioeconomic History  . Marital status: Divorced    Spouse name: Not on file  . Number of children: Not on file  . Years of education: Not on file  . Highest education level: Not on file  Occupational History  . Not on file  Social Needs  . Financial resource strain: Not on file  . Food insecurity:    Worry: Not on file    Inability: Not on file  . Transportation needs:    Medical: Not on file    Non-medical: Not on file  Tobacco Use  . Smoking status: Current Every Day Smoker  . Smokeless tobacco: Never Used  Substance and Sexual Activity  . Alcohol use: No  . Drug use: Yes    Types:  Cocaine  . Sexual activity: Not on file  Lifestyle  . Physical activity:    Days per week: Not on file    Minutes per session: Not on file  . Stress: Not on file  Relationships  . Social connections:    Talks on phone: Not on file    Gets together: Not on file    Attends religious service: Not on file    Active member of club or organization: Not on file    Attends meetings of clubs or organizations: Not on file    Relationship status: Not on file  Other Topics Concern  . Not on file  Social History Narrative  . Not on file   Additional Social History:                         Sleep: Fair  Appetite:  Fair  Current Medications: Current Facility-Administered Medications  Medication Dose Route Frequency Provider Last Rate Last Dose  . acetaminophen (TYLENOL) tablet 650 mg  650 mg Oral Q6H PRN Clapacs, John T, MD      . alum & mag hydroxide-simeth (MAALOX/MYLANTA) 200-200-20 MG/5ML suspension 30 mL  30 mL Oral Q4H PRN Clapacs, John T, MD      . ARIPiprazole (ABILIFY) tablet 20 mg  20 mg Oral Daily Chalee Hirota B, MD   20 mg at 10/01/18 0930  . [START ON 10/02/2018]  azithromycin (ZITHROMAX) tablet 250 mg  250 mg Oral Daily Betzayda Braxton B, MD      . benzonatate (TESSALON) capsule 200 mg  200 mg Oral TID Prentiss Hammett B, MD   200 mg at 10/01/18 1127  . magnesium hydroxide (MILK OF MAGNESIA) suspension 30 mL  30 mL Oral Daily PRN Clapacs, John T, MD      . nicotine (NICODERM CQ - dosed in mg/24 hours) patch 21 mg  21 mg Transdermal Once Kailia Starry B, MD   21 mg at 09/30/18 1748  . zolpidem (AMBIEN) tablet 10 mg  10 mg Oral QHS PRN Ava Tangney B, MD        Lab Results:  Results for orders placed or performed during the hospital encounter of 09/28/18 (from the past 48 hour(s))  CBC     Status: Abnormal   Collection Time: 09/30/18  5:39 PM  Result Value Ref Range   WBC 12.6 (H) 4.0 - 10.5 K/uL   RBC 5.20 4.22 - 5.81 MIL/uL   Hemoglobin  15.9 13.0 - 17.0 g/dL   HCT 48.0 39.0 - 52.0 %   MCV 92.3 80.0 - 100.0 fL   MCH 30.6 26.0 - 34.0 pg   MCHC 33.1 30.0 - 36.0 g/dL   RDW 13.6 11.5 - 15.5 %   Platelets 228 150 - 400 K/uL   nRBC 0.0 0.0 - 0.2 %    Comment: Performed at [facility], [address] [place], [place] [address]  Comprehensive metabolic panel     Status: None   Collection Time: 09/30/18  5:39 PM  Result Value Ref Range   Sodium 140 135 - 145 mmol/L   Potassium 3.9 3.5 - 5.1 mmol/L   Chloride 105 98 - 111 mmol/L   CO2 27 22 - 32 mmol/L   Glucose, Bld 77 70 - 99 mg/dL   BUN 12 6 - 20 mg/dL   Creatinine, Ser 1.06 0.61 - 1.24 mg/dL   Calcium 9.3 8.9 - 10.3 mg/dL   Total Protein 7.1 6.5 - 8.1 g/dL   Albumin 3.7 3.5 - 5.0 g/dL   AST 30 15 - 41 U/L   ALT 38 0 - 44 U/L   Alkaline Phosphatase 51 38 - 126 U/L   Total Bilirubin 0.7 0.3 - 1.2 mg/dL   GFR calc non Af Amer >60 >60 mL/min   GFR calc Af Amer >60 >60 mL/min    Comment: (NOTE) The eGFR has been calculated using the CKD EPI equation. This calculation has not been validated in all clinical situations. eGFR's persistently <60 mL/min signify possible Chronic Kidney Disease.    Anion gap 8 5 - 15    Comment: Performed at [facility], [address] [place], [place] [address]    Blood Alcohol level:  Lab Results  Component Value Date   [facility] <10 68/61/6837    Metabolic Disorder Labs: Lab Results  Component Value Date   HGBA1C 5.3 09/28/2018   MPG 105.41 09/28/2018   No results found for: PROLACTIN Lab Results  Component Value Date   CHOL 193 09/28/2018   TRIG 66 09/28/2018   HDL 43 09/28/2018   CHOLHDL 4.5 09/28/2018   VLDL 13 09/28/2018   LDLCALC 137 (H) 09/28/2018    Physical Findings: AIMS:  , ,  ,  ,    CIWA:    COWS:     Musculoskeletal: Strength & Muscle Tone: within normal limits Gait & Station: normal Patient leans: N/A  Psychiatric Specialty Exam: Physical Exam  Nursing note and vitals  reviewed. Psychiatric: His affect is blunt. His speech is slurred. He is withdrawn. Thought content is paranoid and delusional. Cognition and memory are normal. He expresses impulsivity. He exhibits a depressed mood.    Review of Systems  Neurological: Negative.   Psychiatric/Behavioral: Positive for depression, substance abuse and suicidal ideas.  All other systems reviewed and are negative.   Blood pressure 120/72, pulse 74, temperature 98.1 F (36.7 C), resp. rate 18, height '5\' 11"'  (1.803 m), weight 90.7 kg, SpO2 98 %.Body mass index is 27.89 kg/m.  General Appearance: Fairly Groomed  Eye Contact:  Fair  Speech:  Slurred  Volume:  Decreased  Mood:  Depressed, Dysphoric and Irritable  Affect:  Congruent and Depressed  Thought Process:  Goal Directed and Descriptions of Associations: Intact  Orientation:  Full (Time, Place, and Person)  Thought Content:  Paranoid Ideation  Suicidal Thoughts:  Yes.  with intent/plan  Homicidal Thoughts:  No  Memory:  Immediate;   Fair Recent;   Fair Remote;   Fair  Judgement:  Poor  Insight:  Lacking  Psychomotor Activity:  Decreased  Concentration:  Concentration: Fair and Attention Span: Fair  Recall:  [employer] of Knowledge:  Fair  Language:  Fair  Akathisia:  No  Handed:  Right  AIMS (if indicated):     Assets:  Communication Skills Desire for Improvement Housing Physical Health Resilience Social Support  ADL's:  Intact  Cognition:  WNL  Sleep:  Number of Hours: 7     Treatment Plan Summary: Daily contact with patient to assess and evaluate symptoms and progress in treatment and Medication management   Mr, Langhorst is a 47 year old male with a history of bipolar depression admitted for suicidal ideation with a plan.  #Suicidal ideation -patient is able to contract for safaty in the hospital  #Mood -continue Abilify 20 mg daily -discontinue Tegretol, patient refuses  #Insomnia Ambien 10 mg nightly  PRN  #Bronchitis -chest X-ray on admission negative -WBC 12 -Z-pack as directed -Tessalon TID  #Cocaine use -denies problems, declines treatment  #Smoking cessation -nicotine patch is available  #Labs -lipid panet. TSH, A1C are normal -EKG reviewed, NSR with QTc 408  #Disposition -discharge to home -follow up with Ashok Croon, MD 10/01/2018, 2:35 PM

## 2018-10-01 DIAGNOSIS — F314 Bipolar disorder, current episode depressed, severe, without psychotic features: Principal | ICD-10-CM

## 2018-10-01 MED ORDER — BENZONATATE 100 MG PO CAPS
200.0000 mg | ORAL_CAPSULE | Freq: Three times a day (TID) | ORAL | Status: DC
  Administered 2018-10-01 – 2018-10-02 (×4): 200 mg via ORAL
  Filled 2018-10-01 (×5): qty 2

## 2018-10-01 MED ORDER — AZITHROMYCIN 250 MG PO TABS
250.0000 mg | ORAL_TABLET | Freq: Every day | ORAL | Status: DC
  Administered 2018-10-02: 250 mg via ORAL
  Filled 2018-10-01: qty 1

## 2018-10-01 MED ORDER — AZITHROMYCIN 500 MG PO TABS
500.0000 mg | ORAL_TABLET | Freq: Every day | ORAL | Status: AC
  Administered 2018-10-01: 500 mg via ORAL
  Filled 2018-10-01: qty 1

## 2018-10-01 NOTE — Plan of Care (Signed)
Received Nicholas Horne this AM after breakfast, he returned to his bed, but was compliant with his medications. He got up to eat lunch. He completed his Self Inventory form and continues to endorse feeling anxious and depressed. He rated hopelessness and rated it 8/10, although he stated feeling better. He is trying to stay positive. His affect remains flat and sad.

## 2018-10-01 NOTE — Progress Notes (Signed)
Recreation Therapy Notes   Date: 10/01/2018  Time: 9:30 am   Location: Craft room   Behavioral response: N/A   Intervention Topic: Problem Solving  Discussion/Intervention: Patient did not attend group.   Clinical Observations/Feedback:  Patient did not attend group.   Pharrah Rottman LRT/CTRS         Iva Posten 10/01/2018 10:33 AM

## 2018-10-01 NOTE — [facility] Group Notes (Signed)
LCSW Group Therapy Note  10/01/2018 1:00 pm  Type of Therapy/Topic:  Group Therapy:  Balance in Life  Participation Level:  Minimal  Description of Group:    This group will address the concept of balance and how it feels and looks when one is unbalanced. Patients will be encouraged to process areas in their lives that are out of balance and identify reasons for remaining unbalanced. Facilitators will guide patients in utilizing problem-solving interventions to address and correct the stressor making their life unbalanced. Understanding and applying boundaries will be explored and addressed for obtaining and maintaining a balanced life. Patients will be encouraged to explore ways to assertively make their unbalanced needs known to significant others in their lives, using other group members and facilitator for support and feedback.  Therapeutic Goals: 1. Patient will identify two or more emotions or situations they have that consume much of in their lives. 2. Patient will identify signs/triggers that life has become out of balance:  3. Patient will identify two ways to set boundaries in order to achieve balance in their lives:  4. Patient will demonstrate ability to communicate their needs through discussion and/or role plays  Summary of Patient Progress: Khye did not engage much in today's group discussion on balance in life.  Thaddeaus did share when presented with Question #1 above that "work" has consumed much of his life and it has been good on one hand in that he can financially support his family, but on the other hand he doesn't have time to spend with his family. Elyjah did not stay for the entire group and left about 10 minutes into the group.     Therapeutic Modalities:   Cognitive Behavioral Therapy Solution-Focused Therapy Assertiveness Training  Alease Frame, [place] 10/01/2018 3:55 PM

## 2018-10-02 MED ORDER — AZITHROMYCIN 250 MG PO TABS: ORAL_TABLET | ORAL | 0 refills | Status: DC

## 2018-10-02 MED ORDER — ARIPIPRAZOLE 20 MG PO TABS: 20.0000 mg | ORAL_TABLET | Freq: Every day | ORAL | 0 refills | Status: DC

## 2018-10-02 MED ORDER — ARIPIPRAZOLE ER 400 MG IM SRER
400.0000 mg | INTRAMUSCULAR | Status: DC
  Administered 2018-10-02: 400 mg via INTRAMUSCULAR
  Filled 2018-10-02: qty 2

## 2018-10-02 MED ORDER — ARIPIPRAZOLE ER 400 MG IM SRER: 400.0000 mg | INTRAMUSCULAR | 1 refills | Status: DC

## 2018-10-02 NOTE — Progress Notes (Signed)
Patient denies SI/HI, denies A/V hallucinations. Patient verbalizes understanding of discharge instructions, follow up care and prescriptions.7 days medicine given to patient. Patient given all belongings from [facility] locker. Patient escorted out by staff, transported by family. 

## 2018-10-02 NOTE — Plan of Care (Signed)
Active in the milieu, pleasant and cooperative. Expressing readiness for discharge.

## 2018-10-02 NOTE — Progress Notes (Signed)
Received Nicholas Horne this AM after breakfast, he was compliant with his medications. He denied all of the psychiatric symptoms including suicidal. He is focused in returning to work and moving on with his life. He received his discharge order, the AVS was reviewed and his questions answered. He received his prescription, returned to work letter and his personal belongings. His wife will pick him up at 1500 hrs.

## 2018-10-02 NOTE — Progress Notes (Signed)
Recreation Therapy Notes  INPATIENT RECREATION TR PLAN  Patient Details Name: Nicholas Horne MRN: [id number] DOB: [birth year] Today's Date: 10/02/2018  Rec Therapy Plan Is patient appropriate for Therapeutic Recreation?: Yes Treatment times per week: at least 3 Estimated Length of Stay: 5-7 days TR Treatment/Interventions: Group participation (Comment)  Discharge Criteria Pt will be discharged from therapy if:: Discharged Treatment plan/goals/alternatives discussed and agreed upon by:: Patient/family  Discharge Summary Short term goals set: Patient will identify 3 positive coping skills strategies to use post d/c within 5 recreation therapy group sessions Short term goals met: Adequate for discharge Progress toward goals comments: Groups attended Which groups?: Communication Reason goals not met: N/A Therapeutic equipment acquired: N/A Reason patient discharged from therapy: Discharge from hospital Pt/family agrees with progress & goals achieved: Yes Date patient discharged from therapy: 10/02/18   Peggyann Zwiefelhofer 10/02/2018, 3:15 PM

## 2018-10-02 NOTE — BHH Suicide Risk Assessment (Signed)
[facility] Discharge Suicide Risk Assessment   Principal Problem: Bipolar I disorder, most recent episode depressed, severe without psychotic features [facility]) Discharge Diagnoses:  Patient Active Problem List   Diagnosis Date Noted  . Bipolar I disorder, most recent episode depressed, severe without psychotic features (HCC) [F31.4] 09/28/2018    Priority: High  . Tobacco use disorder [F17.200] 09/29/2018  . Cocaine use disorder, moderate, dependence (HCC) [F14.20] 09/28/2018  . Suicidal ideation [R45.851] 09/28/2018    Total Time spent with patient: 20 minutes  Musculoskeletal: Strength & Muscle Tone: within normal limits Gait & Station: normal Patient leans: N/A  Psychiatric Specialty Exam: Review of Systems  Neurological: Negative.   Psychiatric/Behavioral: Negative.   All other systems reviewed and are negative.   Blood pressure 139/85, pulse 89, temperature (!) 97.4 F (36.3 C), temperature source Oral, resp. rate 16, height 5\' 11"  (1.803 m), weight 90.7 kg, SpO2 97 %.Body mass index is 27.89 kg/m.  General Appearance: Casual  Eye Contact::  Good  Speech:  Clear and Coherent409  Volume:  Normal  Mood:  Euthymic  Affect:  Appropriate  Thought Process:  Goal Directed and Descriptions of Associations: Intact  Orientation:  Full (Time, Place, and Person)  Thought Content:  WDL  Suicidal Thoughts:  No  Homicidal Thoughts:  No  Memory:  Immediate;   Fair Recent;   Fair Remote;   Fair  Judgement:  Fair  Insight:  Present  Psychomotor Activity:  Normal  Concentration:  Fair  Recall:  [employer] of Knowledge:Fair  Language: Fair  Akathisia:  No  Handed:  Right  AIMS (if indicated):     Assets:  Communication Skills Desire for Improvement Housing Intimacy Physical Health Resilience Social Support Transportation Vocational/Educational  Sleep:  Number of Hours: 6.5  Cognition: WNL  ADL's:  Intact   Mental Status Per Nursing Assessment::   On Admission:  Self-harm  thoughts, Suicidal ideation indicated by patient  Demographic Factors:  Male  Loss Factors: NA  Historical Factors: Impulsivity  Risk Reduction Factors:   Responsible for children under 23 years of age, Sense of responsibility to family, Living with another person, especially a relative and Positive social support  Continued Clinical Symptoms:  Bipolar Disorder:   Mixed State  Cognitive Features That Contribute To Risk:  None    Suicide Risk:  Minimal: No identifiable suicidal ideation.  Patients presenting with no risk factors but with morbid ruminations; may be classified as minimal risk based on the severity of the depressive symptoms  Follow-up Information    [facility] Follow up.   Contact information: [address] [place] [address] [phone]           Plan Of Care/Follow-up recommendations:  Activity:  as tolerated Diet:  low sodium heat healthy Other:  keep follow up appointments  Kristine Linea, MD 10/02/2018, 11:50 AM

## 2018-10-02 NOTE — Progress Notes (Signed)
Patient was present in the milieu until bedtime. Upon the beginning of this shift, patient was in the dayroom with staff and peers. Was watching TV and talking with peers. Expressing readiness for discharge.  Maintained a pleasant allure and was cooperative. Participated in evening activities. Had a snack then presented to the medication room: discussed about his current situation stating "my son is going to nursing school to be like you guys, I don't want him to work hard like me.Marland KitchenMarland KitchenI am a plumber, I work too hard, look at my hands....". Patient Requested Ambien and did not have any other concerns. Went to bed and stayed awake for a while, then fell asleep. Support and encouragements provided. Safety and security maintained.

## 2018-10-02 NOTE — Progress Notes (Signed)
Recreation Therapy Notes  Date: 10/02/2018  Time: 9:30 am  Location: Craft Room  Behavioral response: Appropriate   Intervention Topic: Communication  Discussion/Intervention:  Group content today was focused on communication. The group defined communication and ways to communicate with others. Individuals stated reason why communication is important and some reasons to communicate with others. Patients expressed if they thought they were good at communicating with others and ways they could improve their communication skills. The group identified important parts of communication and some experiences they have had in the past with communication. The group participated in the intervention "What is that?", where they had a chance to test out their communication skills and identify ways to improve their communication techniques.  Clinical Observations/Feedback:  Patient came to group and stated sometimes people try to respond to what you are saying without listening. Individual was social with peers and staff while participating in the intervention. Fabrizio Filip LRT/CTRS          Jamarkus Lisbon 10/02/2018 1:04 PM

## 2018-10-02 NOTE — Discharge Summary (Signed)
Physician Discharge Summary Note  Patient:  Nicholas Horne is an 47 y.o., male MRN:  [id number] DOB:  16-Jun-1971 Patient phone:  There is no home phone number on file.  Patient address:   [address] [place] [address],  Total Time spent with patient: 20 minutes plus 15 min oncare coordination and documentation.  Date of Admission:  09/28/2018 Date of Discharge: 10/02/2018  Reason for Admission:  Suicidal ideation.  History of Present Illness:  Identifying data. Mr. Nicholas Horne is a 47 year old male with a history of bipolar disorder.  Chief complaint. "I have been thinking about killing myself."  History of present illness. Information was obtained from the patient and the chart. The patient came to the ER complaining of worsening of depression with suicidal ideation. He borrowed a shotgun from his father to go "hunting" but talked to his wife and brother and the gun was removed. For the past several months, and especially weeks, he became more disfunctional. He was unable to sleep or focused, became irritable, hyperactive, insomniac, confused, paranoid, easily angered but also suicidal, tearful and emotional. He denies hallucinations. Denies panic attacks or social anxiety. Reports nightmares and flashback of PTSD type and OCD. Used cocaine "once". Denies other drugs.   Past psychatric history. Two prior hospitalization at [facility] for depression and mania. Treated with Lithium and Seroquel that he could not tolerate. Better on Abilify. One aborted suicide with a gun. Prior history of substance abuse.  Family psychiatric history. None.  Social history. Married with 12-year-old child. Works as a Biochemist, clinical. Supportive family.   Principal Problem: Bipolar I disorder, most recent episode depressed, severe without psychotic features [facility]) Discharge Diagnoses: Patient Active Problem List   Diagnosis Date Noted  . Bipolar I disorder, most recent episode depressed, severe without  psychotic features (HCC) [F31.4] 09/28/2018    Priority: High  . Tobacco use disorder [F17.200] 09/29/2018  . Cocaine use disorder, moderate, dependence (HCC) [F14.20] 09/28/2018  . Suicidal ideation [R45.851] 09/28/2018   Past Medical History: History reviewed. No pertinent past medical history. History reviewed. No pertinent surgical history.  Family History: History reviewed. No pertinent family history. Social History:  Social History   Substance and Sexual Activity  Alcohol Use No     Social History   Substance and Sexual Activity  Drug Use Yes  . Types: Cocaine    Social History   Socioeconomic History  . Marital status: Divorced    Spouse name: Not on file  . Number of children: Not on file  . Years of education: Not on file  . Highest education level: Not on file  Occupational History  . Not on file  Social Needs  . Financial resource strain: Not on file  . Food insecurity:    Worry: Not on file    Inability: Not on file  . Transportation needs:    Medical: Not on file    Non-medical: Not on file  Tobacco Use  . Smoking status: Current Every Day Smoker  . Smokeless tobacco: Never Used  Substance and Sexual Activity  . Alcohol use: No  . Drug use: Yes    Types: Cocaine  . Sexual activity: Not on file  Lifestyle  . Physical activity:    Days per week: Not on file    Minutes per session: Not on file  . Stress: Not on file  Relationships  . Social connections:    Talks on phone: Not on file    Gets together: Not on  file    Attends religious service: Not on file    Active member of club or organization: Not on file    Attends meetings of clubs or organizations: Not on file    Relationship status: Not on file  Other Topics Concern  . Not on file  Social History Narrative  . Not on file    Hospital Course:    Mr, Nicholas Horne is a 47 year old male with a history of bipolar depression admitted for suicidal ideation with a plan. He was started on  medications, including Abilify maintena which he tolerated well. At the time of discharge, the patient is no longer suicidal or homicidal. He is able to contract for safety. He is fowrad thinking and optimistic about the future.   #Mood, imroved -continueAbilify20 mg daily for two weeks -continue Abilify maintena 400 mg monthy injections, next dose on 10/30/2018  #Bronchitis -chest X-ray on admission negative -compete Z-pack as directed  #Cocaine use -denies problems, declines treatment  #Smoking cessation -nicotine patch is available  #Labs -lipid panet. TSH, [facility] normal -EKGreviewed, NSR with QTc 408  #Disposition -discharge to home -follow up with [facility]  Physical Findings: AIMS:  , ,  ,  ,    CIWA:    COWS:     Musculoskeletal: Strength & Muscle Tone: within normal limits Gait & Station: normal Patient leans: N/A  Psychiatric Specialty Exam: Physical Exam  Nursing note and vitals reviewed. Psychiatric: He has a normal mood and affect. His speech is normal and behavior is normal. Thought content normal. Cognition and memory are normal. He expresses impulsivity.    Review of Systems  Neurological: Negative.   Psychiatric/Behavioral: Negative.   All other systems reviewed and are negative.   Blood pressure 139/85, pulse 89, temperature (!) 97.4 F (36.3 C), temperature source Oral, resp. rate 16, height 5\' 11"  (1.803 m), weight 90.7 kg, SpO2 97 %.Body mass index is 27.89 kg/m.  General Appearance: Casual  Eye Contact:  Good  Speech:  Clear and Coherent  Volume:  Normal  Mood:  Euthymic  Affect:  Appropriate  Thought Process:  Goal Directed and Descriptions of Associations: Intact  Orientation:  Full (Time, Place, and Person)  Thought Content:  WDL  Suicidal Thoughts:  No  Homicidal Thoughts:  No  Memory:  Immediate;   Fair Recent;   Fair Remote;   Fair  Judgement:  Fair  Insight:  Present  Psychomotor Activity:  Normal  Concentration:   Concentration: Fair and Attention Span: Fair  Recall:  [employer] of Knowledge:  Fair  Language:  Fair  Akathisia:  No  Handed:  Right  AIMS (if indicated):     Assets:  Communication Skills Desire for Improvement Housing Physical Health Resilience Social Support Transportation Vocational/Educational  ADL's:  Intact  Cognition:  WNL  Sleep:  Number of Hours: 6.5     Have you used any form of tobacco in the last 30 days? (Cigarettes, Smokeless Tobacco, Cigars, and/or Pipes): Yes  Has this patient used any form of tobacco in the last 30 days? (Cigarettes, Smokeless Tobacco, Cigars, and/or Pipes) Yes, Yes, A prescription for an FDA-approved tobacco cessation medication was offered at discharge and the patient refused  Blood Alcohol level:  Lab Results  Component Value Date   [facility] <10 09/28/2018    Metabolic Disorder Labs:  Lab Results  Component Value Date   HGBA1C 5.3 09/28/2018   MPG 105.41 09/28/2018   No results found for: PROLACTIN Lab Results  Component  Value Date   CHOL 193 09/28/2018   TRIG 66 09/28/2018   HDL 43 09/28/2018   CHOLHDL 4.5 09/28/2018   VLDL 13 09/28/2018   LDLCALC 137 (H) 09/28/2018    See Psychiatric Specialty Exam and Suicide Risk Assessment completed by Attending Physician prior to discharge.  Discharge destination:  Home  Is patient on multiple antipsychotic therapies at discharge:  No   Has Patient had three or more failed trials of antipsychotic monotherapy by history:  No  Recommended Plan for Multiple Antipsychotic Therapies: NA  Discharge Instructions    Diet - low sodium heart healthy   Complete by:  As directed    Increase activity slowly   Complete by:  As directed      Allergies as of 10/02/2018   No Known Allergies     Medication List    TAKE these medications     Indication  ARIPiprazole ER 400 MG Srer injection Commonly known as:  ABILIFY MAINTENA Inject 2 mLs (400 mg total) into the muscle every 28  (twenty-eight) days. Next dose on October 30 2018  Indication:  MIXED BIPOLAR AFFECTIVE DISORDER   ARIPiprazole 20 MG tablet Commonly known as:  ABILIFY Take 1 tablet (20 mg total) by mouth daily. Start taking on:  10/03/2018  Indication:  MIXED BIPOLAR AFFECTIVE DISORDER   azithromycin 250 MG tablet Commonly known as:  ZITHROMAX As directed Start taking on:  10/03/2018  Indication:  Infection caused by Mycoplasma Bacteria      Follow-up Information    [facility] Follow up.   Contact information: [address] [place] [address] [phone]           Follow-up recommendations:  Activity:  as tolerated Diet:  low sodium hrat healthy Other:  keep follow up appointments  Comments:    Signed: Kristine Linea, MD 10/02/2018, 7:39 PM

## 2019-06-07 ENCOUNTER — Encounter: Payer: Self-pay | Admitting: Emergency Medicine

## 2019-06-07 ENCOUNTER — Ambulatory Visit
Admission: EM | Admit: 2019-06-07 | Discharge: 2019-06-07 | Disposition: A | Discharge status: Home / Self Care | Payer: Self-pay | Attending: Physician Assistant | Admitting: Physician Assistant

## 2019-06-07 ENCOUNTER — Other Ambulatory Visit: Payer: Self-pay

## 2019-06-07 DIAGNOSIS — H6123 Impacted cerumen, bilateral: Secondary | ICD-10-CM

## 2019-06-07 HISTORY — DX: Hypoglycemia, unspecified: E16.2

## 2019-06-07 NOTE — ED Triage Notes (Signed)
Pt presents to [facility] for assessment of left ear pain and diminished hearing and external sensitivity x 3 days after his wife cleaned out his ears for him with q-tips.  Patient has tried two home remedy kits without success.

## 2019-06-07 NOTE — ED Notes (Signed)
Patient able to ambulate independently  

## 2019-06-08 NOTE — ED Provider Notes (Signed)
[name]    CSN: [id number] Arrival date & time: 06/07/19  1223      History   Chief Complaint Chief Complaint  Patient presents with  . Otalgia    HPI Vevelyn FrancoisSantino R Steffensen is a 48 y.o. male.   The history is provided by the patient. No language interpreter was used.  Otalgia Location:  Bilateral Behind ear:  No abnormality Quality:  Aching Severity:  No pain Onset quality:  Gradual Timing:  Constant Progression:  Worsening Chronicity:  New Relieved by:  Nothing Worsened by:  Nothing Ineffective treatments:  None tried Risk factors: no chronic ear infection     Past Medical History:  Diagnosis Date  . Hypoglycemia     Patient Active Problem List   Diagnosis Date Noted  . Tobacco use disorder 09/29/2018  . Cocaine use disorder, moderate, dependence (HCC) 09/28/2018  . Suicidal ideation 09/28/2018  . Bipolar I disorder, most recent episode depressed, severe without psychotic features (HCC) 09/28/2018    History reviewed. No pertinent surgical history.     Home Medications    Prior to Admission medications   Medication Sig Start Date End Date Taking? Authorizing Provider  ARIPiprazole (ABILIFY) 20 MG tablet Take 1 tablet (20 mg total) by mouth daily. 10/03/18   Pucilowska, Braulio ConteJolanta B, MD  ARIPiprazole ER (ABILIFY MAINTENA) 400 MG SRER injection Inject 2 mLs (400 mg total) into the muscle every 28 (twenty-eight) days. Next dose on October 30 2018 10/02/18   Kristine LineaPucilowska, Jolanta B, MD  azithromycin (ZITHROMAX) 250 MG tablet As directed 10/03/18   Shari ProwsPucilowska, Jolanta B, MD    Family History History reviewed. No pertinent family history.  Social History Social History   Tobacco Use  . Smoking status: Current Some Day Smoker  . Smokeless tobacco: Never Used  Substance Use Topics  . Alcohol use: No  . Drug use: Yes    Types: Cocaine     Allergies   Patient has no known allergies.   Review of Systems Review of Systems  HENT: Positive  for ear pain.   All other systems reviewed and are negative.    Physical Exam Triage Vital Signs ED Triage Vitals  Enc Vitals Group     BP 06/07/19 1234 129/81     Pulse Rate 06/07/19 1234 83     Resp 06/07/19 1234 18     Temp 06/07/19 1234 98.1 F (36.7 C)     Temp Source 06/07/19 1234 Oral     SpO2 06/07/19 1234 99 %     Weight --      Height --      Head Circumference --      Peak Flow --      Pain Score 06/07/19 1236 5     Pain Loc --      Pain Edu? --      Excl. in GC? --    No data found.  Updated Vital Signs BP 129/81 (BP Location: Left Arm)   Pulse 83   Temp 98.1 F (36.7 C) (Oral)   Resp 18   SpO2 99%   Visual Acuity Right Eye Distance:   Left Eye Distance:   Bilateral Distance:    Right Eye Near:   Left Eye Near:    Bilateral Near:     Physical Exam Vitals signs and nursing note reviewed.  Constitutional:      Appearance: He is well-developed.  HENT:     Head: Normocephalic and atraumatic.  Right Ear: There is impacted cerumen.     Left Ear: There is impacted cerumen.  Eyes:     Conjunctiva/sclera: Conjunctivae normal.  Neck:     Musculoskeletal: Neck supple.  Cardiovascular:     Rate and Rhythm: Normal rate and regular rhythm.     Heart sounds: No murmur.  Pulmonary:     Effort: Pulmonary effort is normal. No respiratory distress.     Breath sounds: Normal breath sounds.  Abdominal:     Tenderness: There is no abdominal tenderness.  Skin:    General: Skin is warm and dry.  Neurological:     Mental Status: He is alert.      UC Treatments / Results  Labs (all labs ordered are listed, but only abnormal results are displayed) Labs Reviewed - No data to display  EKG   Radiology No results found.  Procedures Procedures (including critical care time)  Medications Ordered in UC Medications - No data to display  Initial Impression / Assessment and Plan / UC Course  I have reviewed the triage vital signs and the nursing  notes.  Pertinent labs & imaging results that were available during my care of the patient were reviewed by me and considered in my medical decision making (see chart for details).     RN irrigatted canals/  Pt feels much better Final Clinical Impressions(s) / UC Diagnoses   Final diagnoses:  Bilateral impacted cerumen   Discharge Instructions   None    ED Prescriptions    None     Controlled Substance Prescriptions [place] Controlled Substance Registry consulted? Not Applicable  An After Visit Summary was printed and given to the patient.    Fransico Meadow, [place] 06/08/19 1400

## 2019-07-06 ENCOUNTER — Other Ambulatory Visit: Payer: Self-pay

## 2019-07-06 ENCOUNTER — Emergency Department (HOSPITAL_COMMUNITY)
Admission: EM | Admit: 2019-07-06 | Discharge: 2019-07-07 | Disposition: A | Discharge status: Home / Self Care | Payer: BC Managed Care – PPO | Attending: Emergency Medicine | Admitting: Emergency Medicine

## 2019-07-06 ENCOUNTER — Emergency Department (HOSPITAL_COMMUNITY): Payer: BC Managed Care – PPO

## 2019-07-06 ENCOUNTER — Encounter: Payer: Self-pay | Admitting: Emergency Medicine

## 2019-07-06 ENCOUNTER — Encounter (HOSPITAL_COMMUNITY): Payer: Self-pay

## 2019-07-06 ENCOUNTER — Ambulatory Visit
Admission: EM | Admit: 2019-07-06 | Discharge: 2019-07-06 | Disposition: A | Discharge status: Home / Self Care | Payer: BC Managed Care – PPO | Source: Home / Self Care

## 2019-07-06 DIAGNOSIS — F17210 Nicotine dependence, cigarettes, uncomplicated: Secondary | ICD-10-CM | POA: Insufficient documentation

## 2019-07-06 DIAGNOSIS — J40 Bronchitis, not specified as acute or chronic: Secondary | ICD-10-CM | POA: Insufficient documentation

## 2019-07-06 DIAGNOSIS — F17200 Nicotine dependence, unspecified, uncomplicated: Secondary | ICD-10-CM

## 2019-07-06 DIAGNOSIS — Z79899 Other long term (current) drug therapy: Secondary | ICD-10-CM | POA: Insufficient documentation

## 2019-07-06 DIAGNOSIS — R0602 Shortness of breath: Secondary | ICD-10-CM

## 2019-07-06 DIAGNOSIS — R0789 Other chest pain: Secondary | ICD-10-CM | POA: Diagnosis not present

## 2019-07-06 DIAGNOSIS — R070 Pain in throat: Secondary | ICD-10-CM | POA: Diagnosis not present

## 2019-07-06 DIAGNOSIS — R110 Nausea: Secondary | ICD-10-CM | POA: Diagnosis not present

## 2019-07-06 DIAGNOSIS — R079 Chest pain, unspecified: Secondary | ICD-10-CM

## 2019-07-06 LAB — CBC
HCT: 43.3 % (ref 39.0–52.0)
Hemoglobin: 14.3 g/dL (ref 13.0–17.0)
MCH: 30.9 pg (ref 26.0–34.0)
MCHC: 33 g/dL (ref 30.0–36.0)
MCV: 93.5 fL (ref 80.0–100.0)
Platelets: 246 10*3/uL (ref 150–400)
RBC: 4.63 MIL/uL (ref 4.22–5.81)
RDW: 14.5 % (ref 11.5–15.5)
WBC: 8.3 10*3/uL (ref 4.0–10.5)
nRBC: 0 % (ref 0.0–0.2)

## 2019-07-06 LAB — BASIC METABOLIC PANEL
Anion gap: 9 (ref 5–15)
BUN: 15 mg/dL (ref 6–20)
CO2: 25 mmol/L (ref 22–32)
Calcium: 9.6 mg/dL (ref 8.9–10.3)
Chloride: 106 mmol/L (ref 98–111)
Creatinine, Ser: 0.97 mg/dL (ref 0.61–1.24)
GFR calc Af Amer: >60 mL/min (ref >60)
GFR calc non Af Amer: >60 mL/min (ref >60)
Glucose, Bld: 96 mg/dL (ref 70–99)
Potassium: 4.3 mmol/L (ref 3.5–5.1)
Sodium: 140 mmol/L (ref 135–145)

## 2019-07-06 LAB — TROPONIN I (HIGH SENSITIVITY)
Troponin I (High Sensitivity): 3 ng/L (ref <18)
Troponin I (High Sensitivity): 4 ng/L (ref <18)

## 2019-07-06 MED ORDER — CETIRIZINE HCL 5 MG/5ML PO SOLN
5.0000 mg | Freq: Once | ORAL | Status: AC
  Administered 2019-07-06: 5 mg via ORAL
  Filled 2019-07-06: qty 5

## 2019-07-06 MED ORDER — ASPIRIN 81 MG PO CHEW
324.0000 mg | CHEWABLE_TABLET | Freq: Once | ORAL | Status: AC
  Administered 2019-07-06: 324 mg via ORAL

## 2019-07-06 MED ORDER — ALBUTEROL SULFATE HFA 108 (90 BASE) MCG/ACT IN AERS
2.0000 | INHALATION_SPRAY | Freq: Once | RESPIRATORY_TRACT | Status: AC
  Administered 2019-07-06: 2 via RESPIRATORY_TRACT
  Filled 2019-07-06: qty 6.7

## 2019-07-06 NOTE — Discharge Instructions (Signed)
Patient transported to ER via EMS for further evaluation of chest pain.

## 2019-07-06 NOTE — ED Notes (Signed)
Patient picked up by [facility].  Report provided and AVS sent with EMS to hospital, as well as copy of EKG.

## 2019-07-06 NOTE — ED Notes (Signed)
Called EMS for patient transport. 

## 2019-07-06 NOTE — ED Triage Notes (Addendum)
Pt presents to [facility] for assessment of a rasp in his voice, some pain with swallowing in his throat since last night, nausea last night as well.  Today he c/o feeling more malaise, some chest tightness, and muscle aches.  Denies fevers or chills.  Hx of bronchitis and being a smoker, now smokes 1-2 cigarettes a day, but used to smoke 1 pack/day for 20 years.  .  Patient states pain in his chest with deep breaths.  Patient states there last time this happened was approx 1 month ago and he was coughing up thick black mucous.

## 2019-07-06 NOTE — ED Triage Notes (Addendum)
Pt BIB [facility] from UC for eval of chest tightness. Per note, pt went to UC for eval of voice hoarseness, sore throat, myalgias and malaise. Pt also stated chest tightness since last night. Pain worse w/ inspiration +cough. UC admin 324 ASA

## 2019-07-06 NOTE — ED Notes (Signed)
Pt inquiring about wait time and results. This RN advised patient that once he is seen by a provider they will go over results with him. This RN apologized for wait time and encouraged patient to wait and be seen by a provider, pt verbalized agreement to wait a little longer.

## 2019-07-06 NOTE — ED Notes (Signed)
Patient able to ambulate independently  

## 2019-07-06 NOTE — ED Provider Notes (Signed)
[name]    CSN: [id number] Arrival date & time: 07/06/19  1627     History   Chief Complaint Chief Complaint  Patient presents with  . Sore Throat    HPI Nicholas Horne is a 48 y.o. male with remote history of cocaine use, current tobacco use, bipolar 1 disorder presenting for chest discomfort since yesterday at 3 PM.  Patient endorsing constant chest tightness, generalized achiness, malaise and repeatedly states, "I just do not feel like myself ".  Patient denies history of stroke or heart attack.  Patient notes to have some throat irritation and nausea last night as well, no emesis.  Patient has not tried anything for his symptoms.  Does not currently have primary care.   Past Medical History:  Diagnosis Date  . Hypoglycemia     Patient Active Problem List   Diagnosis Date Noted  . Tobacco use disorder 09/29/2018  . Cocaine use disorder, moderate, dependence ([place]) 09/28/2018  . Suicidal ideation 09/28/2018  . Bipolar I disorder, most recent episode depressed, severe without psychotic features ([place]) 09/28/2018    History reviewed. No pertinent surgical history.     Home Medications    Prior to Admission medications   Medication Sig Start Date End Date Taking? Authorizing Provider  ARIPiprazole (ABILIFY) 20 MG tablet Take 1 tablet (20 mg total) by mouth daily. 10/03/18   Pucilowska, Herma Ard B, MD  ARIPiprazole ER (ABILIFY MAINTENA) 400 MG SRER injection Inject 2 mLs (400 mg total) into the muscle every 28 (twenty-eight) days. Next dose on October 30 2018 10/02/18   Clovis Fredrickson, MD    Family History No family history on file.  Social History Social History   Tobacco Use  . Smoking status: Current Some Day Smoker  . Smokeless tobacco: Never Used  Substance Use Topics  . Alcohol use: No  . Drug use: Yes    Types: Cocaine     Allergies   Abilify [aripiprazole]   Review of Systems Review of Systems  Constitutional: Positive for  fatigue. Negative for fever.  Respiratory: Positive for cough. Negative for shortness of breath.   Cardiovascular: Positive for chest pain. Negative for palpitations and leg swelling.  Gastrointestinal: Positive for nausea. Negative for abdominal pain, diarrhea and vomiting.  Musculoskeletal: Positive for myalgias. Negative for arthralgias.  Skin: Negative for rash and wound.  Neurological: Negative for speech difficulty and headaches.  All other systems reviewed and are negative.    Physical Exam Triage Vital Signs ED Triage Vitals [07/06/19 1647]  Enc Vitals Group     BP 117/73     Pulse Rate 77     Resp 18     Temp 97.9 F (36.6 C)     Temp Source Oral     SpO2 96 %     Weight      Height      Head Circumference      Peak Flow      Pain Score      Pain Loc      Pain Edu?      Excl. in [place]?    No data found.  Updated Vital Signs BP 117/73 (BP Location: Left Arm)   Pulse 77   Temp 97.9 F (36.6 C) (Oral)   Resp 18   SpO2 96%   Visual Acuity Right Eye Distance:   Left Eye Distance:   Bilateral Distance:    Right Eye Near:   Left Eye Near:  Bilateral Near:     Physical Exam Constitutional:      General: He is not in acute distress.    Comments: Patient mildly diaphoretic  HENT:     Head: Normocephalic and atraumatic.     Mouth/Throat:     Mouth: Mucous membranes are moist.     Pharynx: Oropharynx is clear. No pharyngeal swelling or oropharyngeal exudate.  Eyes:     General: No scleral icterus.    Pupils: Pupils are equal, round, and reactive to light.  Neck:     Vascular: No JVD.     Trachea: No tracheal deviation.  Cardiovascular:     Rate and Rhythm: Normal rate and regular rhythm.     Comments: Heart sounds are distant Pulmonary:     Effort: Pulmonary effort is normal.     Breath sounds: No decreased breath sounds, wheezing, rhonchi or rales.  Chest:     Chest wall: No deformity, tenderness or crepitus.  Musculoskeletal: Normal range of  motion.     Right lower leg: He exhibits no tenderness. No edema.     Left lower leg: He exhibits no tenderness. No edema.  Lymphadenopathy:     Cervical: No cervical adenopathy.  Skin:    General: Skin is warm.     Capillary Refill: Capillary refill takes less than 2 seconds.     Coloration: Skin is not cyanotic, jaundiced or pale.  Neurological:     General: No focal deficit present.     Mental Status: He is alert and oriented to person, place, and time.  Psychiatric:        Mood and Affect: Mood normal.        Behavior: Behavior normal.      UC Treatments / Results  Labs (all labs ordered are listed, but only abnormal results are displayed) Labs Reviewed - No data to display  EKG   Radiology Dg Chest 2 View  Result Date: 07/06/2019 CLINICAL DATA:  Chest pain. EXAM: CHEST - 2 VIEW COMPARISON:  Chest x-ray dated September 28, 2018. FINDINGS: The heart size and mediastinal contours are within normal limits. Both lungs are clear. The visualized skeletal structures are unremarkable. IMPRESSION: No active cardiopulmonary disease. Electronically Signed   By: Obie DredgeWilliam T Derry M.D.   On: 07/06/2019 19:22    Procedures Procedures (including critical care time)  Medications Ordered in UC Medications  aspirin chewable tablet 324 mg (324 mg Oral Given 07/06/19 1810)    Initial Impression / Assessment and Plan / UC Course  I have reviewed the triage vital signs and the nursing notes.  Pertinent labs & imaging results that were available during my care of the patient were reviewed by me and considered in my medical decision making (see chart for details).     1.  Chest pain EKG done in office, reviewed by me and compared to previous from 09/30/2018: Sinus rhythm with sinus arrhythmia and single PVC without obvious ST elevation, QTc prolongation.  There is concern for worsening T wave inversion amplitude in lead III with new T wave inversion in aVF in conjunction with T wave amplitude  decrease in leads V1 through V5 with new T wave inversion in V6.  Patient given 3 to 24 mg aspirin orally in office which he tolerated well.  Patient transported to ER for cardiac rule out by EMS in stable condition. Final Clinical Impressions(s) / UC Diagnoses   Final diagnoses:  Chest pain, unspecified type     Discharge Instructions  Patient transported to ER via EMS for further evaluation of chest pain.    ED Prescriptions    None     Controlled Substance Prescriptions [place] Controlled Substance Registry consulted? Not Applicable   Shea EvansHall-Potvin, Brittany, [place]PA-C 07/06/19 2052

## 2019-07-07 MED ORDER — CETIRIZINE HCL 10 MG PO CAPS: 10.0000 mg | ORAL_CAPSULE | Freq: Two times a day (BID) | ORAL | 1 refills | Status: DC

## 2019-07-07 MED ORDER — PREDNISONE 10 MG PO TABS: 50.0000 mg | ORAL_TABLET | Freq: Every day | ORAL | 0 refills | Status: DC

## 2019-07-07 NOTE — Discharge Instructions (Signed)
We signed the ER for shortness of breath. Please use the inhaler every 4 hours as needed for shortness of breath and chest tightness. Take the medications provided to prevent any allergic reaction.  It is prudent that he follow-up with a primary care doctor for further evaluation of the underlying cause for your symptoms.

## 2019-07-07 NOTE — ED Provider Notes (Addendum)
[facility] Provider Note   CSN: [id number] Arrival date & time: 07/06/19  1857     History   Chief Complaint Chief Complaint  Patient presents with  . Chest Pain    HPI Nicholas Horne is a 48 y.o. male.     HPI  48 year old male comes in a chief complaint of chest discomfort, shortness of breath. He reports that he went to an urgent care today because he was just not feeling well.  His symptoms of feeling unwell started yesterday.  He works as a Nutritional therapistplumber and occasionally gets episodes where he has difficulty in breathing and chest tightness.  He had similar episode starting yesterday, went to urgent care thinking he would be having bronchitis.  He reports that at the urgent care an EKG was done and it appeared concerning therefore he was sent to the ER.  Patient states that yesterday started having some throat discomfort.  His voice is different than usual.  He also started noticing some chest discomfort and chest tightness with intermittent episodes of shortness of breath.  Patient is also having body aches and chills.  He denies any exertional component to his symptoms, in fact he feels that sometimes the symptoms are worse with deep inspiration.  He has had similar symptoms in the past and attributes them to possibly occupational exposures.  Patient's discomfort has been constant.  He denies any associated diaphoresis, nausea.  He also denies any known sick exposures.  Patient used to smoke 1 pack a day, but now he has cut down on his smoking.  He used to use drugs but has not used any in more than a year.  He does not drink alcohol.  Denies any family history of premature CAD.  Past Medical History:  Diagnosis Date  . Hypoglycemia     Patient Active Problem List   Diagnosis Date Noted  . Tobacco use disorder 09/29/2018  . Cocaine use disorder, moderate, dependence ([facility]) 09/28/2018  . Suicidal ideation 09/28/2018  . Bipolar I  disorder, most recent episode depressed, severe without psychotic features ([facility]) 09/28/2018    History reviewed. No pertinent surgical history.      Home Medications    Prior to Admission medications   Medication Sig Start Date End Date Taking? Authorizing Provider  ARIPiprazole (ABILIFY) 20 MG tablet Take 1 tablet (20 mg total) by mouth daily. 10/03/18   Pucilowska, Braulio ConteJolanta B, MD  ARIPiprazole ER (ABILIFY MAINTENA) 400 MG SRER injection Inject 2 mLs (400 mg total) into the muscle every 28 (twenty-eight) days. Next dose on October 30 2018 10/02/18   Pucilowska, Braulio ConteJolanta B, MD  Cetirizine HCl 10 MG CAPS Take 1 capsule (10 mg total) by mouth 2 (two) times daily for 10 days. 07/07/19 07/17/19  Derwood KaplanNanavati, Zamyra Allensworth, MD  predniSONE (DELTASONE) 10 MG tablet Take 5 tablets (50 mg total) by mouth daily. 07/07/19   Derwood KaplanNanavati, Vennie Waymire, MD    Family History History reviewed. No pertinent family history.  Social History Social History   Tobacco Use  . Smoking status: Current Some Day Smoker  . Smokeless tobacco: Never Used  Substance Use Topics  . Alcohol use: No  . Drug use: Yes    Types: Cocaine     Allergies   Abilify [aripiprazole]   Review of Systems Review of Systems  Constitutional: Positive for activity change.  Respiratory: Positive for chest tightness and shortness of breath.   Cardiovascular: Negative for chest pain.  All other systems reviewed  and are negative.    Physical Exam Updated Vital Signs BP (!) 111/57 (BP Location: Right Arm)   Pulse (!) 54   Temp 98.4 F (36.9 C) (Oral)   Resp 18   Wt 91 kg   SpO2 96%   BMI 27.98 kg/m   Physical Exam   ED Treatments / Results  Labs (all labs ordered are listed, but only abnormal results are displayed) Labs Reviewed  BASIC METABOLIC PANEL  CBC  TROPONIN I (HIGH SENSITIVITY)  TROPONIN I (HIGH SENSITIVITY)    EKG None ED ECG REPORT   Date: 07/07/2019  Rate: 68  Rhythm: normal sinus rhythm  QRS Axis: normal   Intervals: normal  ST/T Wave abnormalities: nonspecific ST/T changes  Conduction Disutrbances:none  Narrative Interpretation:   Old EKG Reviewed: unchanged  I have personally reviewed the EKG tracing and agree with the computerized printout as noted.   Radiology Dg Chest 2 View  Result Date: 07/06/2019 CLINICAL DATA:  Chest pain. EXAM: CHEST - 2 VIEW COMPARISON:  Chest x-ray dated September 28, 2018. FINDINGS: The heart size and mediastinal contours are within normal limits. Both lungs are clear. The visualized skeletal structures are unremarkable. IMPRESSION: No active cardiopulmonary disease. Electronically Signed   By: Titus Dubin M.D.   On: 07/06/2019 19:22    Procedures Procedures (including critical care time)  Medications Ordered in ED Medications  albuterol (VENTOLIN HFA) 108 (90 Base) MCG/ACT inhaler 2 puff (2 puffs Inhalation Given 07/06/19 2345)  cetirizine HCl (Zyrtec) 5 MG/5ML solution 5 mg (5 mg Oral Given 07/06/19 2345)     Initial Impression / Assessment and Plan / ED Course  I have reviewed the triage vital signs and the nursing notes.  Pertinent labs & imaging results that were available during my care of the patient were reviewed by me and considered in my medical decision making (see chart for details).        48 year old comes in a chief complaint of chest tightness, shortness of breath.  It appears that his symptoms are more respiratory in nature than cardiac based on history.  He does carry minor heart risk factors such as heavy smoking and prior history of cocaine use.  Patient symptoms have been constant for the last 24 hours.  He is having some body aches, malaise, chills in association with his pleuritic sounding chest pain.  He also has a cough and hoarseness in his voice.  At the urgent care he was tested for COVID-19, however they had some concerning findings on EKG therefore he was sent to the ER.  I do not think he is having ACS.  Troponins were  initiated in the triage, and are well below the institutional cutoffs for ACS on serial testing.  It is quite possible that he is having localized lung reaction to environmental exposures.  Perhaps he has asthma or reactive airway disease.  We will give him Zyrtec.  We give him albuterol inhaler here and he reported feeling better after taking the treatment.  We will send him home with the albuterol and a prescription for Zyrtec.  Prednisone to be taken only if needed.  Chest x-ray is reassuring.  EKG is not showing any acute ischemic findings.  I think we would like him to establish PCP for further management with this recurrent issue.  Final Clinical Impressions(s) / ED Diagnoses   Final diagnoses:  Bronchitis  Shortness of breath    ED Discharge Orders  Ordered    Cetirizine HCl 10 MG CAPS  2 times daily     07/07/19 0028    predniSONE (DELTASONE) 10 MG tablet  Daily     07/07/19 0028              Derwood KaplanNanavati, Saroya Riccobono, MD 07/07/19 0045

## 2019-07-07 NOTE — ED Notes (Signed)
Patient verbalized understanding of dc instructions, vss, ambulatory with nad.   

## 2019-09-08 ENCOUNTER — Encounter: Payer: Self-pay | Admitting: Emergency Medicine

## 2019-09-08 ENCOUNTER — Other Ambulatory Visit: Payer: Self-pay

## 2019-09-08 ENCOUNTER — Ambulatory Visit: Payer: BC Managed Care – PPO

## 2019-09-08 ENCOUNTER — Ambulatory Visit
Admission: EM | Admit: 2019-09-08 | Discharge: 2019-09-08 | Disposition: A | Discharge status: Home / Self Care | Payer: BC Managed Care – PPO | Attending: Emergency Medicine | Admitting: Emergency Medicine

## 2019-09-08 DIAGNOSIS — T162XXA Foreign body in left ear, initial encounter: Secondary | ICD-10-CM | POA: Diagnosis not present

## 2019-09-08 DIAGNOSIS — H9202 Otalgia, left ear: Secondary | ICD-10-CM

## 2019-09-08 NOTE — ED Notes (Signed)
Patient able to ambulate independently  

## 2019-09-08 NOTE — Discharge Instructions (Addendum)
To help prevent ear infections, use a solution of 1 part isopropyl (rubbing) alcohol, 1 part white vinegar (acetic acid) in both ears after swimming.

## 2019-09-08 NOTE — ED Triage Notes (Signed)
Pt presents to [facility] for left ear popping x 2 weeks, with pain beginning 1 week ago.  Patient states "it feels like something is growing in there".

## 2019-09-08 NOTE — ED Provider Notes (Signed)
[name]    CSN: [id number] Arrival date & time: 09/08/19  0835      History   Chief Complaint Chief Complaint  Patient presents with  . Otalgia    HPI Nicholas Horne is a 48 y.o. male with history of cerumen impaction presenting for left ear popping intermittently for the last 2 weeks with pain since 1 week ago.  Patient feels like something is in his ear.  Patient denies foreign body exposure, water exposure, trauma to the area.  Has not tried anything for his discomfort.  Past Medical History:  Diagnosis Date  . Hypoglycemia     Patient Active Problem List   Diagnosis Date Noted  . Tobacco use disorder 09/29/2018  . Cocaine use disorder, moderate, dependence ([facility]) 09/28/2018  . Suicidal ideation 09/28/2018  . Bipolar I disorder, most recent episode depressed, severe without psychotic features ([facility]) 09/28/2018    History reviewed. No pertinent surgical history.     Home Medications    Prior to Admission medications   Medication Sig Start Date End Date Taking? Authorizing Provider  ARIPiprazole (ABILIFY) 20 MG tablet Take 1 tablet (20 mg total) by mouth daily. 10/03/18   Pucilowska, Braulio Conte B, MD  ARIPiprazole ER (ABILIFY MAINTENA) 400 MG SRER injection Inject 2 mLs (400 mg total) into the muscle every 28 (twenty-eight) days. Next dose on October 30 2018 10/02/18   Pucilowska, Braulio Conte B, MD  Cetirizine HCl 10 MG CAPS Take 1 capsule (10 mg total) by mouth 2 (two) times daily for 10 days. 07/07/19 07/17/19  Derwood Kaplan, MD    Family History History reviewed. No pertinent family history.  Social History Social History   Tobacco Use  . Smoking status: Former Games developer  . Smokeless tobacco: Never Used  Substance Use Topics  . Alcohol use: No  . Drug use: Yes    Types: Cocaine     Allergies   Abilify [aripiprazole]   Review of Systems Review of Systems  Constitutional: Negative for fatigue and fever.  HENT: Positive for ear pain.  Negative for congestion, dental problem, ear discharge, facial swelling, hearing loss, sinus pain, sore throat, trouble swallowing and voice change.   Eyes: Negative for photophobia, pain and visual disturbance.  Respiratory: Negative for cough and shortness of breath.   Cardiovascular: Negative for chest pain and palpitations.  Gastrointestinal: Negative for diarrhea and vomiting.  Musculoskeletal: Negative for arthralgias and myalgias.  Neurological: Negative for dizziness and headaches.     Physical Exam Triage Vital Signs ED Triage Vitals [09/08/19 0844]  Enc Vitals Group     BP (!) 160/104     Pulse Rate 79     Resp 18     Temp 98.1 F (36.7 C)     Temp Source Temporal     SpO2 96 %     Weight      Height      Head Circumference      Peak Flow      Pain Score 7     Pain Loc      Pain Edu?      Excl. in [place]?    No data found.  Updated Vital Signs BP (!) 160/104 (BP Location: Right Arm)   Pulse 79   Temp 98.1 F (36.7 C) (Temporal)   Resp 18   SpO2 96%    Physical Exam Constitutional:      General: He is not in acute distress. HENT:     Head: Normocephalic  and atraumatic.     Right Ear: Tympanic membrane, ear canal and external ear normal. There is no impacted cerumen.     Left Ear: Tympanic membrane and external ear normal. There is no impacted cerumen.     Ears:     Comments: Negative tragal tenderness bilaterally .  Single hair noted near TM and EAC without injection/edema    Nose: No nasal deformity, congestion or rhinorrhea.     Mouth/Throat:     Mouth: Mucous membranes are moist.     Tongue: Tongue does not deviate from midline.     Pharynx: Oropharynx is clear. Uvula midline.     Comments: No tonsillar hypertrophy or exudate Eyes:     General: No scleral icterus.    Conjunctiva/sclera: Conjunctivae normal.     Pupils: Pupils are equal, round, and reactive to light.  Neck:     Musculoskeletal: Normal range of motion and neck supple. No muscular  tenderness.  Cardiovascular:     Rate and Rhythm: Normal rate.  Pulmonary:     Effort: Pulmonary effort is normal. No respiratory distress.     Breath sounds: No wheezing.  Lymphadenopathy:     Cervical: No cervical adenopathy.  Neurological:     Mental Status: He is alert.      UC Treatments / Results  Labs (all labs ordered are listed, but only abnormal results are displayed) Labs Reviewed - No data to display  EKG   Radiology No results found.  Procedures Procedures (including critical care time)  Medications Ordered in UC Medications - No data to display  Initial Impression / Assessment and Plan / UC Course  I have reviewed the triage vital signs and the nursing notes.  Pertinent labs & imaging results that were available during my care of the patient were reviewed by me and considered in my medical decision making (see chart for details).     Ear irrigation performed of left ear in office: Patient tolerated well with successful removal of hair.  Patient to take Flonase for intermittent popping sensation has there are no exam findings concerning for AOE/AOM today. Final Clinical Impressions(s) / UC Diagnoses   Final diagnoses:  Foreign body of left ear, initial encounter     Discharge Instructions     To help prevent ear infections, use a solution of 1 part isopropyl (rubbing) alcohol, 1 part white vinegar (acetic acid) in both ears after swimming.    ED Prescriptions    None     PDMP not reviewed this encounter.   Hall-Potvin, [place], PA-C 09/08/19 1210

## 2019-09-09 ENCOUNTER — Telehealth: Payer: Self-pay

## 2019-10-02 ENCOUNTER — Other Ambulatory Visit: Payer: Self-pay

## 2019-10-02 ENCOUNTER — Ambulatory Visit
Admission: EM | Admit: 2019-10-02 | Discharge: 2019-10-02 | Disposition: A | Discharge status: Home / Self Care | Payer: BC Managed Care – PPO | Attending: Emergency Medicine | Admitting: Emergency Medicine

## 2019-10-02 DIAGNOSIS — Z20828 Contact with and (suspected) exposure to other viral communicable diseases: Secondary | ICD-10-CM | POA: Diagnosis not present

## 2019-10-02 DIAGNOSIS — Z20822 Contact with and (suspected) exposure to covid-19: Secondary | ICD-10-CM

## 2019-10-02 DIAGNOSIS — J069 Acute upper respiratory infection, unspecified: Secondary | ICD-10-CM

## 2019-10-02 NOTE — ED Triage Notes (Addendum)
Pt presents to UC w/ c/o fatigue, chest achiness, slight fever,body aches since this morning.

## 2019-10-02 NOTE — Discharge Instructions (Signed)
Your COVID test is pending - it is important to quarantine / isolate at home until your results are back. °If you test positive and would like further evaluation for persistent or worsening symptoms, you may schedule an E-visit or virtual (video) visit throughout the [facility] MyChart app or website. ° °PLEASE NOTE: If you develop severe chest pain or shortness of breath please go to the ER or call [phone] for further evaluation --> DO NOT schedule electronic or virtual visits for this. °Please call our office for further guidance / recommendations as needed. °

## 2019-10-02 NOTE — ED Provider Notes (Signed)
[name]    CSN: [id number] Arrival date & time: 10/02/19  1041      History   Chief Complaint Chief Complaint  Patient presents with  . fatigue, congestion    HPI Nicholas Horne is a 48 y.o. male with history of tobacco use, cocaine use presenting for fatigue, myalgias, elevated temperature since this morning.  T-max 99.60F: Patient took Tylenol with adequate relief.  Denies headache, chest pain, cough, shortness of breath.  Of note, patient does work out 7 days a week without adverse effect, though today felt much more tired than usual.  Patient denies history of rhabdomyolysis, change in urine output/appearance.  Patient works as a Nutritional therapist, has been going into patient's homes: No known Covid contact, though states "people don't always tell us because they want to work to get done".   Past Medical History:  Diagnosis Date  . Hypoglycemia     Patient Active Problem List   Diagnosis Date Noted  . Tobacco use disorder 09/29/2018  . Cocaine use disorder, moderate, dependence (HCC) 09/28/2018  . Suicidal ideation 09/28/2018  . Bipolar I disorder, most recent episode depressed, severe without psychotic features (HCC) 09/28/2018    History reviewed. No pertinent surgical history.     Home Medications    Prior to Admission medications   Medication Sig Start Date End Date Taking? Authorizing Provider  Cetirizine HCl 10 MG CAPS Take 1 capsule (10 mg total) by mouth 2 (two) times daily for 10 days. 07/07/19 07/17/19  Derwood Kaplan, MD    Family History Family History  Problem Relation Age of Onset  . Healthy Mother   . Diabetes Father     Social History Social History   Tobacco Use  . Smoking status: Former Games developer  . Smokeless tobacco: Never Used  Substance Use Topics  . Alcohol use: No  . Drug use: Not Currently    Types: Cocaine     Allergies   Abilify [aripiprazole]   Review of Systems Review of Systems  Constitutional: Positive for  appetite change and fatigue. Negative for fever.  HENT: Positive for congestion. Negative for ear pain, facial swelling, sinus pain, sore throat, trouble swallowing and voice change.   Eyes: Negative for pain and redness.  Respiratory: Negative for cough, shortness of breath and wheezing.   Cardiovascular: Negative for chest pain, palpitations and leg swelling.  Gastrointestinal: Negative for abdominal pain, blood in stool, diarrhea and vomiting.  Genitourinary: Negative for difficulty urinating, dysuria, frequency and hematuria.  Musculoskeletal: Positive for myalgias. Negative for arthralgias and gait problem.  Skin: Negative for rash and wound.  Neurological: Negative for speech difficulty and headaches.  All other systems reviewed and are negative.    Physical Exam Triage Vital Signs ED Triage Vitals  Enc Vitals Group     BP 10/02/19 1050 (!) 142/82     Pulse Rate 10/02/19 1050 100     Resp 10/02/19 1050 18     Temp 10/02/19 1050 98.2 F (36.8 C)     Temp Source 10/02/19 1050 Oral     SpO2 10/02/19 1050 96 %     Weight --      Height --      Head Circumference --      Peak Flow --      Pain Score 10/02/19 1054 0     Pain Loc --      Pain Edu? --      Excl. in GC? --    No  data found.  Updated Vital Signs BP (!) 142/82   Pulse 100   Temp 98.2 F (36.8 C) (Oral)   Resp 18   SpO2 96%   Visual Acuity  Physical Exam Constitutional:      General: He is not in acute distress.    Appearance: He is normal weight. He is ill-appearing. He is not toxic-appearing or diaphoretic.  HENT:     Head: Normocephalic and atraumatic.     Mouth/Throat:     Mouth: Mucous membranes are moist.     Pharynx: Oropharynx is clear.  Eyes:     General: No scleral icterus.    Conjunctiva/sclera: Conjunctivae normal.     Pupils: Pupils are equal, round, and reactive to light.  Cardiovascular:     Rate and Rhythm: Normal rate and regular rhythm.     Heart sounds: No murmur. No gallop.    Pulmonary:     Effort: Pulmonary effort is normal. No respiratory distress.     Breath sounds: No wheezing or rales.     Comments: Good air entry bilaterally without prolonged expiratory phase Musculoskeletal: Normal range of motion.        General: No swelling or tenderness.     Right lower leg: No edema.     Left lower leg: No edema.  Skin:    Capillary Refill: Capillary refill takes less than 2 seconds.     Coloration: Skin is not jaundiced or pale.     Findings: No rash.  Neurological:     General: No focal deficit present.     Mental Status: He is alert and oriented to person, place, and time.      UC Treatments / Results  Labs (all labs ordered are listed, but only abnormal results are displayed) Labs Reviewed  NOVEL CORONAVIRUS, NAA    EKG   Radiology No results found.  Procedures Procedures (including critical care time)  Medications Ordered in UC Medications - No data to display  Initial Impression / Assessment and Plan / UC Course  I have reviewed the triage vital signs and the nursing notes.  Pertinent labs & imaging results that were available during my care of the patient were reviewed by me and considered in my medical decision making (see chart for details).    1.  Viral URI Covid testing pending: Patient to quarantine until results are back.  Will treat supportively.  Low concern for rhabdo at this time given no change in urine, denies severe tenderness to palpation.  Reviewed return precautions specifically for rhabdo as well as COVID.  Patient verbalized understanding and is agreeable to plan. Final Clinical Impressions(s) / UC Diagnoses   Final diagnoses:  Viral URI     Discharge Instructions     Your COVID test is pending - it is important to quarantine / isolate at home until your results are back. If you test positive and would like further evaluation for persistent or worsening symptoms, you may schedule an E-visit or virtual (video)  visit throughout the [facility] app or website.  PLEASE NOTE: If you develop severe chest pain or shortness of breath please go to the ER or call 9-1-1 for further evaluation --> DO NOT schedule electronic or virtual visits for this. Please call our office for further guidance / recommendations as needed.    ED Prescriptions    None     PDMP not reviewed this encounter.   Hall-Potvin, [place], [place] 10/02/19 1128

## 2019-10-04 LAB — NOVEL CORONAVIRUS, NAA: SARS-CoV-2, NAA: NOT DETECTED

## 2019-10-06 ENCOUNTER — Ambulatory Visit (INDEPENDENT_AMBULATORY_CARE_PROVIDER_SITE_OTHER): Payer: BC Managed Care – PPO | Admitting: Nurse Practitioner

## 2019-10-06 DIAGNOSIS — J069 Acute upper respiratory infection, unspecified: Secondary | ICD-10-CM | POA: Diagnosis not present

## 2019-10-06 MED ORDER — AZITHROMYCIN 250 MG PO TABS: ORAL_TABLET | ORAL | 0 refills | Status: DC

## 2019-10-06 MED ORDER — ALBUTEROL SULFATE HFA 108 (90 BASE) MCG/ACT IN AERS: 2.0000 | INHALATION_SPRAY | Freq: Four times a day (QID) | RESPIRATORY_TRACT | 0 refills | Status: DC | PRN

## 2019-10-06 MED ORDER — PREDNISONE 20 MG PO TABS: 40.0000 mg | ORAL_TABLET | Freq: Every day | ORAL | 0 refills | Status: AC

## 2019-10-06 NOTE — Progress Notes (Signed)
Virtual Visit via Telephone Note Due to national recommendations of social distancing due to COVID 19, telehealth visit is felt to be most appropriate for this patient at this time.  I discussed the limitations, risks, security and privacy concerns of performing an evaluation and management service by telephone and the availability of in person appointments. I also discussed with the patient that there may be a patient responsible charge related to this service. The patient expressed understanding and agreed to proceed.    I connected with Vevelyn Francois on 10/06/19  at   8:50 AM EST  EDT by telephone and verified that I am speaking with the correct person using two identifiers.   Consent I discussed the limitations, risks, security and privacy concerns of performing an evaluation and management service by telephone and the availability of in person appointments. I also discussed with the patient that there may be a patient responsible charge related to this service. The patient expressed understanding and agreed to proceed.   Location of Patient: Private Residence   Location of Provider: [facility]    Persons participating in Telemedicine visit: Bertram Denver FNP-BC YY Mayo CMA Dalbert Freeman Caldron    History of Present Illness: Telemedicine visit for: Establish Care and with URI symptoms He does have a history of bipolar 1 disorder (depression), cocaine (states only used once) abuse and suicidal ideation. He has had 2 prior hospitalizations for depression mania and has been treated in the past with lithium and Seroquel but she could not tolerate.  Was previously on Abilify however currently not taking any mood stabilizers.  He was evaluated at an urgent care on 10/02/2019 with complaints at that time of fatigue, myalgias, fever.  He was tachypneic, tachycardic, and afebrile at the urgent care.  Covid negative and patient was diagnosed with viral URI.    Upper Respiratory Infection:  Today patient has persistent complaints of  symptoms of a URI. Symptoms include chills, sweats, congestion, coryza, cough, fever and Myalgias, fatigue, shortness of breath. Tmax 102.  He is unaware if he has been exposed to anyone with Covid.  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  He was also treated for bronchitis August 11 of this year.  States he quit smoking 6 months ago.  He is not received the flu vaccine this year nor has he been tested for the flu.    Past Medical History:  Diagnosis Date  . Hypoglycemia     History reviewed. No pertinent surgical history.  Family History  Problem Relation Age of Onset  . Healthy Mother   . Diabetes Father     Social History   Socioeconomic History  . Marital status: Divorced    Spouse name: Not on file  . Number of children: Not on file  . Years of education: Not on file  . Highest education level: Not on file  Occupational History  . Not on file  Social Needs  . Financial resource strain: Not on file  . Food insecurity    Worry: Not on file    Inability: Not on file  . Transportation needs    Medical: Not on file    Non-medical: Not on file  Tobacco Use  . Smoking status: Former Games developer  . Smokeless tobacco: Never Used  Substance and Sexual Activity  . Alcohol use: No  . Drug use: Not Currently    Types: Cocaine  .  Sexual activity: Not on file  Lifestyle  . Physical activity    Days per week: Not on file    Minutes per session: Not on file  . Stress: Not on file  Relationships  . Social Herbalist on phone: Not on file    Gets together: Not on file    Attends religious service: Not on file    Active member of club or organization: Not on file    Attends meetings of clubs or organizations: Not on file    Relationship status: Not on file  Other Topics Concern  . Not on file  Social History Narrative  .  Not on file     Observations/Objective: Awake, alert and oriented x 3   Review of Systems  Constitutional: Positive for chills, diaphoresis, fever and malaise/fatigue.  HENT: Positive for congestion. Negative for ear discharge, ear pain, hearing loss, sinus pain and sore throat.   Eyes: Negative.   Respiratory: Positive for cough and shortness of breath. Negative for sputum production and wheezing.   Cardiovascular: Negative.  Negative for chest pain, orthopnea and leg swelling.  Gastrointestinal: Negative.  Negative for abdominal pain, diarrhea, nausea and vomiting.  Musculoskeletal: Positive for myalgias.  Neurological: Positive for headaches. Negative for dizziness and focal weakness.  Endo/Heme/Allergies: Negative for environmental allergies.  Psychiatric/Behavioral: Positive for depression.    Assessment and Plan: Pastor was seen today for establish care, gastroesophageal reflux and cough.  Diagnoses and all orders for this visit:  Upper respiratory infection, acute -     azithromycin (ZITHROMAX) 250 MG tablet; Take 2 (two) tablets by mouth on day one. Take 1 (one) tablet by mouth on days 2-5. -     predniSONE (DELTASONE) 20 MG tablet; Take 2 tablets (40 mg total) by mouth daily with breakfast for 7 days. -     albuterol (VENTOLIN HFA) 108 (90 Base) MCG/ACT inhaler; Inhale 2 puffs into the lungs every 6 (six) hours as needed for wheezing or shortness of breath (cough).     Follow Up Instructions Return in about 6 weeks (around 11/17/2019) for PHYSICAL.     I discussed the assessment and treatment plan with the patient. The patient was provided an opportunity to ask questions and all were answered. The patient agreed with the plan and demonstrated an understanding of the instructions.   The patient was advised to call back or seek an in-person evaluation if the symptoms worsen or if the condition fails to improve as anticipated.  I provided 16 minutes of non-face-to-face  time during this encounter including median intraservice time, reviewing previous notes, labs, imaging, medications and explaining diagnosis and management.  Gildardo Pounds, FNP-BC

## 2019-10-06 NOTE — Progress Notes (Signed)
Here to get established.  Has c/o GERD. Not taking anything OTC. Has reflux.  Also has c/o fever, chills, body aches, runny nose, productive cough x 2 days. Has been taking Alka Seltzer + with no relief. Possible sick contact with work.

## 2019-12-17 ENCOUNTER — Telehealth: Payer: Self-pay

## 2019-12-17 NOTE — Telephone Encounter (Signed)
Called patient to do their pre-visit COVID screening.  Call went to voicemail. Unable to do prescreening.  

## 2019-12-20 ENCOUNTER — Ambulatory Visit: Payer: BC Managed Care – PPO | Admitting: Internal Medicine

## 2020-03-01 DIAGNOSIS — M9902 Segmental and somatic dysfunction of thoracic region: Secondary | ICD-10-CM | POA: Diagnosis not present

## 2020-03-01 DIAGNOSIS — M9901 Segmental and somatic dysfunction of cervical region: Secondary | ICD-10-CM | POA: Diagnosis not present

## 2020-03-01 DIAGNOSIS — M5414 Radiculopathy, thoracic region: Secondary | ICD-10-CM | POA: Diagnosis not present

## 2020-03-01 DIAGNOSIS — M531 Cervicobrachial syndrome: Secondary | ICD-10-CM | POA: Diagnosis not present

## 2020-03-03 DIAGNOSIS — M9901 Segmental and somatic dysfunction of cervical region: Secondary | ICD-10-CM | POA: Diagnosis not present

## 2020-03-03 DIAGNOSIS — M9902 Segmental and somatic dysfunction of thoracic region: Secondary | ICD-10-CM | POA: Diagnosis not present

## 2020-03-03 DIAGNOSIS — M531 Cervicobrachial syndrome: Secondary | ICD-10-CM | POA: Diagnosis not present

## 2020-03-03 DIAGNOSIS — M5414 Radiculopathy, thoracic region: Secondary | ICD-10-CM | POA: Diagnosis not present

## 2020-03-06 DIAGNOSIS — M531 Cervicobrachial syndrome: Secondary | ICD-10-CM | POA: Diagnosis not present

## 2020-03-06 DIAGNOSIS — M9901 Segmental and somatic dysfunction of cervical region: Secondary | ICD-10-CM | POA: Diagnosis not present

## 2020-03-06 DIAGNOSIS — M9902 Segmental and somatic dysfunction of thoracic region: Secondary | ICD-10-CM | POA: Diagnosis not present

## 2020-03-06 DIAGNOSIS — M5414 Radiculopathy, thoracic region: Secondary | ICD-10-CM | POA: Diagnosis not present

## 2020-03-15 DIAGNOSIS — M9902 Segmental and somatic dysfunction of thoracic region: Secondary | ICD-10-CM | POA: Diagnosis not present

## 2020-03-15 DIAGNOSIS — M5414 Radiculopathy, thoracic region: Secondary | ICD-10-CM | POA: Diagnosis not present

## 2020-03-15 DIAGNOSIS — M9901 Segmental and somatic dysfunction of cervical region: Secondary | ICD-10-CM | POA: Diagnosis not present

## 2020-03-15 DIAGNOSIS — M531 Cervicobrachial syndrome: Secondary | ICD-10-CM | POA: Diagnosis not present

## 2020-03-22 DIAGNOSIS — M9901 Segmental and somatic dysfunction of cervical region: Secondary | ICD-10-CM | POA: Diagnosis not present

## 2020-03-22 DIAGNOSIS — M9902 Segmental and somatic dysfunction of thoracic region: Secondary | ICD-10-CM | POA: Diagnosis not present

## 2020-03-22 DIAGNOSIS — M5414 Radiculopathy, thoracic region: Secondary | ICD-10-CM | POA: Diagnosis not present

## 2020-03-22 DIAGNOSIS — M531 Cervicobrachial syndrome: Secondary | ICD-10-CM | POA: Diagnosis not present

## 2020-07-08 ENCOUNTER — Ambulatory Visit
Admission: EM | Admit: 2020-07-08 | Discharge: 2020-07-08 | Disposition: A | Discharge status: Home / Self Care | Payer: BLUE CROSS/BLUE SHIELD

## 2020-07-08 ENCOUNTER — Ambulatory Visit (INDEPENDENT_AMBULATORY_CARE_PROVIDER_SITE_OTHER): Payer: BLUE CROSS/BLUE SHIELD

## 2020-07-08 ENCOUNTER — Encounter: Payer: Self-pay | Admitting: *Deleted

## 2020-07-08 ENCOUNTER — Other Ambulatory Visit: Payer: Self-pay

## 2020-07-08 DIAGNOSIS — M542 Cervicalgia: Secondary | ICD-10-CM

## 2020-07-08 DIAGNOSIS — R52 Pain, unspecified: Secondary | ICD-10-CM

## 2020-07-08 DIAGNOSIS — M47812 Spondylosis without myelopathy or radiculopathy, cervical region: Secondary | ICD-10-CM | POA: Diagnosis not present

## 2020-07-08 DIAGNOSIS — M2578 Osteophyte, vertebrae: Secondary | ICD-10-CM | POA: Diagnosis not present

## 2020-07-08 DIAGNOSIS — M4802 Spinal stenosis, cervical region: Secondary | ICD-10-CM | POA: Diagnosis not present

## 2020-07-08 MED ORDER — PREDNISONE 10 MG (21) PO TBPK: ORAL_TABLET | Freq: Every day | ORAL | 0 refills | Status: DC
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
## 2020-07-08 NOTE — Discharge Instructions (Addendum)
RICE: rest, ice, compression, elevation as needed for pain.    Heat therapy (hot compress, warm wash rag, hot showers, etc.) can help relax muscles and soothe muscle aches. Cold therapy (ice packs) can be used to help swelling both after injury and after prolonged use of areas of chronic pain/aches.  Pain medication:  Take steroid with breakfast as discussed: 6-5-4-3-2-1  Important to follow up with specialist(s) below for further evaluation/management if your symptoms persist or worsen.
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
## 2020-07-10 DIAGNOSIS — M5412 Radiculopathy, cervical region: Secondary | ICD-10-CM | POA: Diagnosis not present

## 2020-07-10 DIAGNOSIS — M79604 Pain in right leg: Secondary | ICD-10-CM | POA: Diagnosis not present

## 2020-07-10 DIAGNOSIS — M542 Cervicalgia: Secondary | ICD-10-CM | POA: Diagnosis not present

## 2020-07-12 DIAGNOSIS — L821 Other seborrheic keratosis: Secondary | ICD-10-CM | POA: Diagnosis not present

## 2020-07-12 DIAGNOSIS — L738 Other specified follicular disorders: Secondary | ICD-10-CM | POA: Diagnosis not present

## 2020-07-12 DIAGNOSIS — D225 Melanocytic nevi of trunk: Secondary | ICD-10-CM | POA: Diagnosis not present

## 2020-07-12 DIAGNOSIS — L218 Other seborrheic dermatitis: Secondary | ICD-10-CM | POA: Diagnosis not present

## 2020-07-13 DIAGNOSIS — M542 Cervicalgia: Secondary | ICD-10-CM | POA: Diagnosis not present

## 2020-07-17 ENCOUNTER — Ambulatory Visit: Payer: BC Managed Care – PPO | Admitting: Internal Medicine

## 2020-07-17 DIAGNOSIS — M542 Cervicalgia: Secondary | ICD-10-CM | POA: Diagnosis not present

## 2020-07-17 DIAGNOSIS — M5412 Radiculopathy, cervical region: Secondary | ICD-10-CM | POA: Diagnosis not present

## 2020-08-01 ENCOUNTER — Other Ambulatory Visit: Payer: Self-pay

## 2020-08-01 ENCOUNTER — Ambulatory Visit (INDEPENDENT_AMBULATORY_CARE_PROVIDER_SITE_OTHER): Payer: BLUE CROSS/BLUE SHIELD | Admitting: Internal Medicine

## 2020-08-01 ENCOUNTER — Encounter: Payer: Self-pay | Admitting: Internal Medicine

## 2020-08-01 VITALS — BP 125/79 | HR 72 | Temp 97.3°F | Resp 17 | Ht 70.5 in | Wt 208.0 lb

## 2020-08-01 DIAGNOSIS — K219 Gastro-esophageal reflux disease without esophagitis: Secondary | ICD-10-CM | POA: Diagnosis not present

## 2020-08-01 DIAGNOSIS — R29818 Other symptoms and signs involving the nervous system: Secondary | ICD-10-CM

## 2020-08-01 MED ORDER — PANTOPRAZOLE SODIUM 40 MG PO TBEC: 40.0000 mg | DELAYED_RELEASE_TABLET | Freq: Two times a day (BID) | ORAL | 3 refills | Status: DC

## 2020-08-01 NOTE — Progress Notes (Signed)
Subjective:    Nicholas Horne - 49 y.o. male MRN [id number]  Date of birth: [birth year]  HPI  Nicholas Horne is here for acute concerns.  Requests referral for sleep apnea evaluation. Concerns about waking up in middle of night with trouble breathing. Feels like this has worsened as he gained more weight over the past year. Snores at nighttime and endorses excessive daytime sleepiness.   Does have acid reflux as well. OTC medication is not helping with his reflux. Not completely sure which medication he is currently taking.     Health Maintenance:  Health Maintenance Due  Topic Date Due  . COVID-19 Vaccine (1) Never done  . HIV Screening  Never done  . TETANUS/TDAP  Never done  . INFLUENZA VACCINE  Never done    -  reports that he has quit smoking. He has never used smokeless tobacco. - Review of Systems: Per HPI. - Past Medical History: Patient Active Problem List   Diagnosis Date Noted  . Tobacco use disorder 09/29/2018  . Cocaine use disorder, moderate, dependence (HCC) 09/28/2018  . Suicidal ideation 09/28/2018  . Bipolar I disorder, most recent episode depressed, severe without psychotic features (HCC) 09/28/2018   - Medications: reviewed and updated   Objective:   Physical Exam Temp (!) 97.3 F (36.3 C) (Temporal)   Resp 17   Wt 208 lb (94.3 kg)   BMI 29.01 kg/m  Physical Exam Constitutional:      General: He is not in acute distress.    Appearance: He is not diaphoretic.  HENT:     Head: Normocephalic and atraumatic.  Eyes:     Conjunctiva/sclera: Conjunctivae normal.  Cardiovascular:     Rate and Rhythm: Normal rate and regular rhythm.     Heart sounds: Normal heart sounds. No murmur heard.   Pulmonary:     Effort: Pulmonary effort is normal. No respiratory distress.     Breath sounds: Normal breath sounds.  Musculoskeletal:        General: Normal range of motion.  Skin:    General: Skin is warm and dry.  Neurological:     Mental Status: He is  alert and oriented to person, place, and time.  Psychiatric:        Mood and Affect: Affect normal.        Judgment: Judgment normal.            Assessment & Plan:   1. Suspected sleep apnea STOP-Bang score of 5, high risk of OSA. Will work up with sleep study. Potential that some of his symptoms are related to uncontrolled acid reflux as well. While he seems to have mild seasonal allergies and RAD each fall, otherwise has no underlying lung problems and lungs are clear on exam. Has no significant cardiac history or symptoms/exam findings concerning for CHF. If sleep study is negative and GERD improved and symptoms persist, would consider PFTs and echo.  - Home sleep test  2. Gastroesophageal reflux disease, unspecified whether esophagitis present Unclear if patient is taking PPI or H2 blocker at home. However, GERD significantly uncontrolled. Will test for H. Pylori although results may be impacted depending on which type of medication he is taking. Start PPI BID. Discussed diet modifications and lifestyle changes that could help improve symptoms. Ultimately, would like patient to be evaluated by GI.  - pantoprazole (PROTONIX) 40 MG tablet; Take 1 tablet (40 mg total) by mouth 2 (two) times daily.  Dispense: 30 tablet; Refill: 3 -  Ambulatory referral to Gastroenterology - H. pylori breath test     Marcy Siren, D.O. 08/01/2020, 4:04 PM Primary Care at [facility]

## 2020-08-02 ENCOUNTER — Other Ambulatory Visit: Payer: Self-pay | Admitting: Internal Medicine

## 2020-08-02 LAB — H. PYLORI BREATH TEST: H pylori Breath Test: POSITIVE — AB

## 2020-08-02 MED ORDER — PYLERA 140-125-125 MG PO CAPS: 3.0000 | ORAL_CAPSULE | Freq: Three times a day (TID) | ORAL | 0 refills | Status: DC

## 2020-08-03 NOTE — Progress Notes (Signed)
Patient notified of results & recommendations. Expressed understanding.

## 2020-08-11 ENCOUNTER — Other Ambulatory Visit: Payer: Self-pay | Admitting: Family Medicine

## 2020-08-11 ENCOUNTER — Telehealth: Payer: Self-pay

## 2020-08-11 DIAGNOSIS — A048 Other specified bacterial intestinal infections: Secondary | ICD-10-CM

## 2020-08-11 MED ORDER — OMEPRAZOLE 40 MG PO CPDR: 40.0000 mg | DELAYED_RELEASE_CAPSULE | Freq: Two times a day (BID) | ORAL | 0 refills | Status: DC

## 2020-08-11 MED ORDER — AMOXICILLIN 500 MG PO CAPS: 1000.0000 mg | ORAL_CAPSULE | Freq: Two times a day (BID) | ORAL | 0 refills | Status: DC

## 2020-08-11 MED ORDER — CLARITHROMYCIN 500 MG PO TABS: 500.0000 mg | ORAL_TABLET | Freq: Two times a day (BID) | ORAL | 0 refills | Status: DC

## 2020-08-11 NOTE — Telephone Encounter (Signed)
RX's were sent to [facility] for an alternate treatment

## 2020-08-11 NOTE — Telephone Encounter (Signed)
Pylera is on backorder, per pharmacy.  Please send an alternative for patient's H. Pylori treatment.

## 2020-08-11 NOTE — Progress Notes (Signed)
Patient ID: Nicholas Horne, male   DOB: 1971-05-13, 49 y.o.   MRN: [id number]   Pylera was ordered by patient's PCP due to patient having a positive H. Pylori breath test on 08/01/2020 but is on back order. RX's will be sent in for patient to take Amoxicillin, biaxin and omeprazole for treatment of H. Pylori

## 2020-08-18 ENCOUNTER — Encounter: Payer: Self-pay | Admitting: Gastroenterology

## 2020-08-22 ENCOUNTER — Telehealth: Payer: Self-pay

## 2020-08-22 NOTE — Telephone Encounter (Signed)
Patient's wife wanted to call and see about the medication on back order for the pharmacy. Also an update on sleep apnea refferal.

## 2020-08-23 NOTE — Telephone Encounter (Signed)
The sleep test needs to be released per the sleep center.

## 2020-08-23 NOTE — Telephone Encounter (Signed)
Per telephone message on 08/11/20, an alternative medication treatment was sent in place of the [facility].  Ella Bodo can you follow up on the sleep study?

## 2020-08-23 NOTE — Telephone Encounter (Signed)
It didn't give me an option to "release" sleep study. New order entered. Please follow up to make sure that patient gets scheduled since it's been almost a month since the original order was placed.

## 2020-08-23 NOTE — Addendum Note (Signed)
Addended by: Heidi Dach on: 08/23/2020 10:19 AM   Modules accepted: Orders

## 2020-08-24 DIAGNOSIS — Z20822 Contact with and (suspected) exposure to covid-19: Secondary | ICD-10-CM | POA: Diagnosis not present

## 2020-08-24 DIAGNOSIS — Z6828 Body mass index (BMI) 28.0-28.9, adult: Secondary | ICD-10-CM | POA: Diagnosis not present

## 2020-08-24 DIAGNOSIS — R519 Headache, unspecified: Secondary | ICD-10-CM | POA: Diagnosis not present

## 2020-08-24 DIAGNOSIS — R110 Nausea: Secondary | ICD-10-CM | POA: Diagnosis not present

## 2020-09-18 ENCOUNTER — Ambulatory Visit: Payer: Self-pay | Admitting: Orthopedic Surgery
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
## 2020-09-19 ENCOUNTER — Telehealth: Payer: Self-pay | Admitting: Neurology

## 2020-09-19 ENCOUNTER — Encounter: Payer: Self-pay | Admitting: Neurology

## 2020-09-19 ENCOUNTER — Encounter: Payer: Self-pay | Admitting: *Deleted

## 2020-09-19 ENCOUNTER — Ambulatory Visit: Payer: BLUE CROSS/BLUE SHIELD | Admitting: Neurology

## 2020-09-19 NOTE — Telephone Encounter (Signed)
Patient no-showed. If he calls back to schedule let him know that one more no-show and he will be dismissed from our practice thanks.

## 2020-09-19 NOTE — Telephone Encounter (Signed)
There is an FYI on chart flagging for 1 no show.

## 2020-09-19 NOTE — Progress Notes (Signed)
Per referral notes from [facility].

## 2020-10-12 ENCOUNTER — Ambulatory Visit: Payer: BLUE CROSS/BLUE SHIELD | Admitting: Gastroenterology

## 2020-10-18 ENCOUNTER — Other Ambulatory Visit: Payer: Self-pay

## 2020-10-18 ENCOUNTER — Ambulatory Visit (HOSPITAL_BASED_OUTPATIENT_CLINIC_OR_DEPARTMENT_OTHER): Payer: BLUE CROSS/BLUE SHIELD

## 2020-10-18 ENCOUNTER — Encounter: Payer: Self-pay | Admitting: Internal Medicine

## 2020-10-18 ENCOUNTER — Ambulatory Visit (INDEPENDENT_AMBULATORY_CARE_PROVIDER_SITE_OTHER): Payer: BLUE CROSS/BLUE SHIELD | Admitting: Internal Medicine

## 2020-10-18 VITALS — BP 134/83 | HR 88 | Temp 97.2°F | Resp 19 | Wt 208.0 lb

## 2020-10-18 DIAGNOSIS — A048 Other specified bacterial intestinal infections: Secondary | ICD-10-CM | POA: Diagnosis not present

## 2020-10-18 DIAGNOSIS — M542 Cervicalgia: Secondary | ICD-10-CM

## 2020-10-18 DIAGNOSIS — M5412 Radiculopathy, cervical region: Secondary | ICD-10-CM | POA: Diagnosis not present

## 2020-10-18 DIAGNOSIS — J4520 Mild intermittent asthma, uncomplicated: Secondary | ICD-10-CM

## 2020-10-18 DIAGNOSIS — Z01818 Encounter for other preprocedural examination: Secondary | ICD-10-CM | POA: Diagnosis not present

## 2020-10-18 MED ORDER — AMOXICILLIN 500 MG PO CAPS: 1000.0000 mg | ORAL_CAPSULE | Freq: Two times a day (BID) | ORAL | 0 refills | Status: AC

## 2020-10-18 MED ORDER — ALBUTEROL SULFATE HFA 108 (90 BASE) MCG/ACT IN AERS: 2.0000 | INHALATION_SPRAY | Freq: Four times a day (QID) | RESPIRATORY_TRACT | 0 refills | Status: DC | PRN

## 2020-10-18 MED ORDER — CLARITHROMYCIN 500 MG PO TABS: 500.0000 mg | ORAL_TABLET | Freq: Two times a day (BID) | ORAL | 0 refills | Status: AC

## 2020-10-18 MED ORDER — OMEPRAZOLE 40 MG PO CPDR: 40.0000 mg | DELAYED_RELEASE_CAPSULE | Freq: Two times a day (BID) | ORAL | 0 refills | Status: DC

## 2020-10-18 NOTE — Progress Notes (Signed)
Subjective:    Nicholas Horne - 49 y.o. male MRN [id number]  Date of birth: Dec 05, 1970  HPI  Nicholas Horne is here for surgical clearance. Patient is scheduled to have total disc replacement C5-C7 on 12/9. Patient has had surgery before, >20 years ago. History of tonsillectomy and repair of nasal fractures. No prior issues with anesthesia. No PMH of kidney disease, liver disease, CAD, MI, CVA. Does have concerns about OSA symptoms. We ordered a home sleep study test in Sept. Patient reports he is picking up the equipment today. Wakes up chocking and gasping for air with chest tightness every night 2-3 times per night. Patient is a former smoker, quit >1 year ago. No family history of premature death. Denies current chest pain, severe headaches, vision changes.      Health Maintenance:  Health Maintenance Due  Topic Date Due  . COVID-19 Vaccine (1) Never done    -  reports that he has quit smoking. He has never used smokeless tobacco. - Review of Systems: Per HPI. - Past Medical History: Patient Active Problem List   Diagnosis Date Noted  . Tobacco use disorder 09/29/2018  . Cocaine use disorder, moderate, dependence (HCC) 09/28/2018  . Suicidal ideation 09/28/2018  . Bipolar I disorder, most recent episode depressed, severe without psychotic features (HCC) 09/28/2018   - Medications: reviewed and updated   Objective:   Physical Exam BP 134/83   Pulse 88   Temp (!) 97.2 F (36.2 C) (Temporal)   Resp 17   Wt 208 lb (94.3 kg)   SpO2 96%   BMI 29.42 kg/m  Physical Exam Constitutional:      General: He is not in acute distress.    Appearance: He is not diaphoretic.  HENT:     Head: Normocephalic and atraumatic.  Eyes:     Conjunctiva/sclera: Conjunctivae normal.  Cardiovascular:     Rate and Rhythm: Normal rate and regular rhythm.     Heart sounds: Normal heart sounds. No murmur heard.   Pulmonary:     Effort: Pulmonary effort is normal. No respiratory distress.      Breath sounds: Normal breath sounds.  Musculoskeletal:        General: Normal range of motion.  Skin:    General: Skin is warm and dry.  Neurological:     Mental Status: He is alert and oriented to person, place, and time.  Psychiatric:        Mood and Affect: Affect normal.        Judgment: Judgment normal.            Assessment & Plan:   1. Preoperative clearance Patient has NSQIP risk below average for complications of surgery. However, given symptoms very concerning for OSA would recommend not proceeding with surgical procedure until sleep study results are obtained and if needed patient is set up with CPAP machine.   2. Cervical spine pain 3. Cervical radiculopathy Followed by orthopedics and recommended to have C5-7 total disc replacement.   4. Helicobacter pylori infection Has still not completed treatment for H. Pylori. Medications sent.  - amoxicillin (AMOXIL) 500 MG capsule; Take 2 capsules (1,000 mg total) by mouth 2 (two) times daily for 10 days.  Dispense: 40 capsule; Refill: 0 - omeprazole (PRILOSEC) 40 MG capsule; Take 1 capsule (40 mg total) by mouth 2 (two) times daily.  Dispense: 20 capsule; Refill: 0 - clarithromycin (BIAXIN) 500 MG tablet; Take 1 tablet (500 mg total) by mouth 2 (two)  times daily for 10 days.  Dispense: 20 tablet; Refill: 0  5. Mild intermittent reactive airway disease without complication - albuterol (VENTOLIN HFA) 108 (90 Base) MCG/ACT inhaler; Inhale 2 puffs into the lungs every 6 (six) hours as needed for wheezing or shortness of breath (cough).  Dispense: 18 g; Refill: 0     Marcy Siren, D.O. 10/18/2020, 10:12 AM Primary Care at [facility]

## 2020-10-24 ENCOUNTER — Ambulatory Visit (HOSPITAL_BASED_OUTPATIENT_CLINIC_OR_DEPARTMENT_OTHER): Payer: BLUE CROSS/BLUE SHIELD | Attending: Internal Medicine | Admitting: Internal Medicine

## 2020-10-24 DIAGNOSIS — G4733 Obstructive sleep apnea (adult) (pediatric): Secondary | ICD-10-CM | POA: Diagnosis not present

## 2020-10-24 DIAGNOSIS — G4730 Sleep apnea, unspecified: Secondary | ICD-10-CM | POA: Diagnosis not present

## 2020-10-24 DIAGNOSIS — R29818 Other symptoms and signs involving the nervous system: Secondary | ICD-10-CM

## 2020-10-24 DIAGNOSIS — G4736 Sleep related hypoventilation in conditions classified elsewhere: Secondary | ICD-10-CM | POA: Insufficient documentation

## 2020-10-27 ENCOUNTER — Other Ambulatory Visit: Payer: Self-pay

## 2020-10-27 ENCOUNTER — Ambulatory Visit: Payer: Self-pay | Admitting: Orthopedic Surgery

## 2020-10-27 NOTE — H&P (Signed)
Subjective:   Nicholas Horne is a pleasant 49 year old male who initially presented To our office on 8/16/20221 with neck pain and radicular right arm pain as well as right arm weakness. Since then, patient has undergone appropriate conservative care including chiropractic care, innjection therapy, over-the-counter medications and prescription medications. Imaging studies do reveal significant stenosis and nerve compression that correlates with his symptoms. Patient got significant relief in his pain for approximately 2 weeks following a right-sided C7 selective nerve root block, unfortunately did can continue to have weakness and the results are only short lived. At this point the pain and weakness is affecting his quality-of-life and he would like to move forward with surgical intervention. He is scheduled for TDR C5-7 At [facility] On 11/02/20 With Dr. Shon Baton.  Patient Active Problem List   Diagnosis Date Noted  . Tobacco use disorder 09/29/2018  . Cocaine use disorder, moderate, dependence (HCC) 09/28/2018  . Suicidal ideation 09/28/2018  . Bipolar I disorder, most recent episode depressed, severe without psychotic features (HCC) 09/28/2018   Past Medical History:  Diagnosis Date  . Bipolar disorder (manic depression) (HCC)   . Hypoglycemia     No past surgical history on file.  Current Outpatient Medications  Medication Sig Dispense Refill Last Dose  . albuterol (VENTOLIN HFA) 108 (90 Base) MCG/ACT inhaler Inhale 2 puffs into the lungs every 6 (six) hours as needed for wheezing or shortness of breath (cough). 18 g 0   . amoxicillin (AMOXIL) 500 MG capsule Take 2 capsules (1,000 mg total) by mouth 2 (two) times daily for 10 days. 40 capsule 0   . clarithromycin (BIAXIN) 500 MG tablet Take 1 tablet (500 mg total) by mouth 2 (two) times daily for 10 days. 20 tablet 0   . Multiple Vitamin (MULTIVITAMIN WITH MINERALS) TABS tablet Take 3 tablets by mouth daily.     Marland Kitchen omeprazole (PRILOSEC) 40 MG capsule  Take 1 capsule (40 mg total) by mouth 2 (two) times daily. 20 capsule 0   . pantoprazole (PROTONIX) 40 MG tablet Take 1 tablet (40 mg total) by mouth 2 (two) times daily. (Patient not taking: Reported on 10/25/2020) 30 tablet 3   . TESTOSTERONE CYPIONATE IM Inject 250 mg into the muscle every Wednesday.     Marland Kitchen ZINC-MAGNESIUM ASPART-VIT B6 PO Take 3 tablets by mouth daily.      No current facility-administered medications for this visit.   Allergies  Allergen Reactions  . Abilify [Aripiprazole] Other (See Comments)    Suicidal thoughts  . Gabapentin     Felt funny    Social History   Tobacco Use  . Smoking status: Former Games developer  . Smokeless tobacco: Never Used  Substance Use Topics  . Alcohol use: No    Family History  Problem Relation Age of Onset  . Healthy Mother   . Diabetes Father     Review of Systems As stated in HPI  Objective:   Vitals: Ht: 5 ft 10.5 in 10/27/2020 07:52 am BP: 124/72 L arm 10/27/2020 07:56 am Pulse: 76 bpm 10/27/2020 07:58 am  General: AAOX3, well developed and well nourished, NAD  Ambulation: normal gait pattern, uses no assistive device.  Inspection: No obvious deformity  Heart: Regular rate and rhythm, no rubs, murmurs, or gallops  Lungs: Clear auscultation bilaterally  Abdomen: Bowel sounds 4, nontender, nondistended, no rebound tenderness, no loss of bladder or bowel control  Palpation: Tender over spinous processes and neck musculature.  AROM: -Significant pain with range of motion of cervical  spine radiating to the right upper extremity -Shoulder, elbow, and wrists AROM normal and pain free.  Dermatomes: Radicular right arm pain in the C6 and C7 dermatome pattern  Myotomes: - shoulder shrug: Left 5/5, Right 5/5 -Shoulder Abduction: Left 5/5, Right 5/5 - Elbow flexion: Left 5/5, Right 4+/5 - Elbow extension Left 5/5, Right 4+/5 - Wrist extension: Left 5/5, right 5/5 - Finger abduction: Left 5/5, Right 5/5 - finger  Adduction/squeeze: Left 5/5, Right 5/5  Reflexes: - Hoffman's: Negative  Special Tests: - UE Neural tension test: Left Negative, Right Positive  PV: Extremities warm and well profused. 2+ radial pulses bilaterally  X-Ray impression: 4 view cervical x-ray was taken at outside facility and the images were personally reviewed by me as well as the report. There is loss of disc space height most notable at C5-6 and C6-7. Normal cervical lordosis. No signs of fracture.  MRI Impression: MRI of lumbar spine was performed at [facility] dated July 13, 2020. Both images as well as report were personally reviewed by me. At C6-7 there is a right paracentral disc protrusion osteophyte complex and right arthrosis resulting in severe right neural foraminal stenosis with mild central canal stenosis and mild mass-effect on the right ventral cord. At C5-6 there is a mild disc bulge with bilateral arthrosis resulting in bilateral neural foraminal stenosis at the severe nature. At C3-4 and C4-5 there is left arthrosis resulting in severe left neural foraminal stenosis as well.  Patient reports 2 weeks of significant relief following right C7 selective nerve root block  Assessment:   Nicholas Horne is a pleasant 49 year old male who initially presented To our office on 8/16/20221 with neck pain and radicular right arm pain as well as right arm weakness. Since then, patient has undergone appropriate conservative care including chiropractic care, innjection therapy, over-the-counter medications and prescription medications. Imaging studies do reveal significant stenosis and nerve compression that correlates with his symptoms. Patient got significant relief in his pain for approximately 2 weeks following a right-sided C7 selective nerve root block, unfortunately did can continue to have weakness and the results are only short lived. His clinical exam seems to demonstrate progressive motor weakness as well as  dysesthesias and radicular arm pain. 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 At this point despite Appropriate conservative care he continues to have severe neuropathic pain in neurological deficits. After discussing risks and benefits of surgical intervention, patient has elected to move forward with surgical intervention.  Plan:   Recommended moving forward with a 2 level disc replacement. I have gone over the surgical procedure in great detail including the risks and benefits.Risks and benefits of surgery were discussed with the patient. These include: Infection, bleeding, death, stroke, paralysis, ongoing or worse pain, need for additional surgery, nonunion, leak of spinal fluid, adjacent segment degeneration requiring additional fusion surgery. Pseudoarthrosis (nonunion)requiring supplemental posterior fixation. Throat pain, swallowing difficulties, hoarseness or change in voice.  With respect to disc replacement: Additional risks include heterotopic ossification, inability to place the disc due to technical issues requiring bailout to a fusion procedure.  All of his questions were encouraged and addressed.  At this point, we are still waiting parents from the patient's primary care provider. The primary care provider has ordered a home sleep study which the patient has performance, however these results have not yet been read. The results are scheduled to be read this Saturday and we should have more information regarding  the clearance on Monday. We will plan to move forward with the surgery as scheduled pending clearance on Monday, if the patient is not clear due to sleep study results and we will plan on postponing the surgery.  I did review the patient's medication list with him. He is not on any blood thinners, he is not using any aspirin products, he is not using any anti-inflammatory products. I did inform the patient that  leading up to surgery and in the postoperative period he is to avoid any anti-inflammatory medications or aspirin products and he did express understanding of this. He does take a daily vitamin which I will have him hold 1 week prior to surgery as well.  We have also discussed the post-operative recovery period to include: bathing/showering restrictions, wound healing, activity (and driving) restrictions, medications/pain mangement.  We have also discussed post-operative redflags to include: signs and symptoms of postoperative infection, DVT/PE.  Patient was fitted with a soft cervical collar at today's office visit.  Follow-up: 2 weeks postop

## 2020-10-27 NOTE — Progress Notes (Signed)
[facility] [address] ([address]),  - [address] [place] ([place]) [place] [address] Phone: [phone] Fax: [phone]      Your procedure is scheduled on Thursday, 11/02/2020.  Report to [facility] Main Entrance "A" at 05:30 A.M., and check in at the Admitting office.  Call this number if you have problems the morning of surgery:  [phone]  Call [phone] if you have any questions prior to your surgery date Monday-Friday 8am-4pm    Remember:  Do not eat or drink after midnight the night before your surgery   Take these medicines the morning of surgery with A SIP OF WATER  albuterol (VENTOLIN HFA)- If needed and bring the inhaler with you the day of surgery amoxicillin (AMOXIL) clarithromycin (BIAXIN)  omeprazole (PRILOSEC)     pantoprazole (PROTONIX)   As of today, STOP taking any Aspirin (unless otherwise instructed by your surgeon) Aleve, Naproxen, Ibuprofen, Motrin, Advil, Goody's, BC's, all herbal medications, fish oil, and all vitamins.                      Do not wear jewelry, make up, or nail polish            Do not wear lotions, powders, perfumes/colognes, or deodorant.            Men may shave face and neck.            Do not bring valuables to the hospital.            [facility] is not responsible for any belongings or valuables.  Do NOT Smoke (Tobacco/Vaping) or drink Alcohol 24 hours prior to your procedure If you use a CPAP at night, you may bring all equipment for your overnight stay.   Contacts, glasses, dentures or bridgework may not be worn into surgery.      For patients admitted to the hospital, discharge time will be determined by your treatment team.   Patients discharged the day of surgery will not be allowed to drive home, and someone needs to stay with them for 24 hours.    Special instructions:   [facility]- Preparing For Surgery  Before surgery, you can play an important role. Because skin is not  sterile, your skin needs to be as free of germs as possible. You can reduce the number of germs on your skin by washing with CHG (chlorahexidine gluconate) Soap before surgery.  CHG is an antiseptic cleaner which kills germs and bonds with the skin to continue killing germs even after washing.    Oral Hygiene is also important to reduce your risk of infection.  Remember - BRUSH YOUR TEETH THE MORNING OF SURGERY WITH YOUR REGULAR TOOTHPASTE  Please do not use if you have an allergy to CHG or antibacterial soaps. If your skin becomes reddened/irritated stop using the CHG.  Do not shave (including legs and underarms) for at least 48 hours prior to first CHG shower. It is OK to shave your face.  Please follow these instructions carefully.   1. Shower the NIGHT BEFORE SURGERY and the MORNING OF SURGERY with CHG Soap.   2. If you chose to wash your hair, wash your hair first as usual with your normal shampoo.  3. After you shampoo, rinse your hair and body thoroughly to remove the shampoo.  4. Use CHG as you would any other liquid soap. You can apply CHG directly to the skin and wash gently  with a scrungie or a clean washcloth.   5. Apply the CHG Soap to your body ONLY FROM THE NECK DOWN.  Do not use on open wounds or open sores. Avoid contact with your eyes, ears, mouth and genitals (private parts). Wash Face and genitals (private parts)  with your normal soap.   6. Wash thoroughly, paying special attention to the area where your surgery will be performed.  7. Thoroughly rinse your body with warm water from the neck down.  8. DO NOT shower/wash with your normal soap after using and rinsing off the CHG Soap.  9. Pat yourself dry with a CLEAN TOWEL.  10. Wear CLEAN PAJAMAS to bed the night before surgery  11. Place CLEAN SHEETS on your bed the night of your first shower and DO NOT SLEEP WITH PETS.   Day of Surgery: Wear Clean/Comfortable clothing the morning of surgery Do not apply any  deodorants/lotions.   Remember to brush your teeth WITH YOUR REGULAR TOOTHPASTE.   Please read over the following fact sheets that you were given.

## 2020-10-28 DIAGNOSIS — R29818 Other symptoms and signs involving the nervous system: Secondary | ICD-10-CM

## 2020-10-28 NOTE — Procedures (Signed)
    Patient Name: Nicholas Horne, Nicholas Horne Date: 10/24/2020 Gender: Male D.O.B: [birth year] Age (years): 49 Referring Provider: De Hollingshead DO Height (inches): 70 Interpreting Physician: Jetty Duhamel MD, ABSM Weight (lbs): 208 RPSGT: [place] BMI: 30 MRN: [id number] Neck Size: 17.50  CLINICAL INFORMATION Sleep Study Type: HST Indication for sleep study: OSA Epworth Sleepiness Score: 11  SLEEP STUDY TECHNIQUE A multi-channel overnight portable sleep study was performed. The channels recorded were: nasal airflow, thoracic respiratory movement, and oxygen saturation with a pulse oximetry. Snoring was also monitored.  MEDICATIONS Patient self administered medications include: none reported.  SLEEP ARCHITECTURE Patient was studied for 498.2 minutes. The sleep efficiency was 100.0 % and the patient was supine for 86.7%. The arousal index was 0.0 per hour.  RESPIRATORY PARAMETERS The overall AHI was 80.0 per hour, with a central apnea index of 0.0 per hour. The oxygen nadir was 67% during sleep.  CARDIAC DATA Mean heart rate during sleep was 67.7 bpm.  IMPRESSIONS - Severe obstructive sleep apnea occurred during this study (AHI = 80.0/h). - No significant central sleep apnea occurred during this study (CAI = 0.0/h). - Oxygen desaturation was noted during this study (Min O2 = 67%). Mean O2sat 89%. - Patient snored 1.9%.  DIAGNOSIS - Obstructive Sleep Apnea (G47.33) - Nocturnal Hypoxemia (G47.36)  RECOMMENDATIONS - Suggest CPAP titration sleep study or autopap 5-20. Other options would be based on clinical judgment. - Be careful with alcohol, sedatives and other CNS depressants that may worsen sleep apnea and disrupt normal sleep architecture. - Sleep hygiene should be reviewed to assess factors that may improve sleep quality. - Weight management and regular exercise should be initiated or continued.  [Electronically signed] 10/28/2020 10:56 AM  Jetty Duhamel MD, ABSM Diplomate, American Board of Sleep Medicine   NPI: [id number]                         Jetty Duhamel Diplomate, American Board of Sleep Medicine  ELECTRONICALLY SIGNED ON:  10/28/2020, 10:52 AM [facility] PH: [phone]   FX: [phone] ACCREDITED BY THE AMERICAN ACADEMY OF SLEEP MEDICINE

## 2020-10-30 ENCOUNTER — Encounter (HOSPITAL_COMMUNITY)
Admission: RE | Admit: 2020-10-30 | Discharge: 2020-10-30 | Disposition: A | Discharge status: Home / Self Care | Payer: BLUE CROSS/BLUE SHIELD | Source: Ambulatory Visit | Attending: Orthopedic Surgery | Admitting: Orthopedic Surgery

## 2020-10-30 ENCOUNTER — Ambulatory Visit (HOSPITAL_COMMUNITY)
Admission: RE | Admit: 2020-10-30 | Discharge: 2020-10-30 | Disposition: A | Discharge status: Home / Self Care | Payer: BLUE CROSS/BLUE SHIELD | Source: Ambulatory Visit | Attending: Orthopedic Surgery | Admitting: Orthopedic Surgery

## 2020-10-30 ENCOUNTER — Other Ambulatory Visit: Payer: Self-pay

## 2020-10-30 ENCOUNTER — Other Ambulatory Visit: Payer: Self-pay | Admitting: Internal Medicine

## 2020-10-30 ENCOUNTER — Encounter (HOSPITAL_COMMUNITY): Payer: Self-pay

## 2020-10-30 DIAGNOSIS — Z01818 Encounter for other preprocedural examination: Secondary | ICD-10-CM | POA: Diagnosis not present

## 2020-10-30 DIAGNOSIS — G4733 Obstructive sleep apnea (adult) (pediatric): Secondary | ICD-10-CM | POA: Insufficient documentation

## 2020-10-30 LAB — URINALYSIS, ROUTINE W REFLEX MICROSCOPIC
Bilirubin Urine: NEGATIVE
Glucose, UA: NEGATIVE mg/dL
Hgb urine dipstick: NEGATIVE
Ketones, ur: 5 mg/dL — AB
Leukocytes,Ua: NEGATIVE
Nitrite: NEGATIVE
Protein, ur: NEGATIVE mg/dL
Specific Gravity, Urine: 1.017 (ref 1.005–1.030)
pH: 5 (ref 5.0–8.0)

## 2020-10-30 LAB — BASIC METABOLIC PANEL
Anion gap: 11 (ref 5–15)
BUN: 17 mg/dL (ref 6–20)
CO2: 23 mmol/L (ref 22–32)
Calcium: 9.5 mg/dL (ref 8.9–10.3)
Chloride: 103 mmol/L (ref 98–111)
Creatinine, Ser: 1.18 mg/dL (ref 0.61–1.24)
GFR, Estimated: >60 mL/min (ref >60)
Glucose, Bld: 92 mg/dL (ref 70–99)
Potassium: 3.9 mmol/L (ref 3.5–5.1)
Sodium: 137 mmol/L (ref 135–145)

## 2020-10-30 LAB — CBC
HCT: 54.2 % — ABNORMAL HIGH (ref 39.0–52.0)
Hemoglobin: 17.5 g/dL — ABNORMAL HIGH (ref 13.0–17.0)
MCH: 29.1 pg (ref 26.0–34.0)
MCHC: 32.3 g/dL (ref 30.0–36.0)
MCV: 90 fL (ref 80.0–100.0)
Platelets: 231 10*3/uL (ref 150–400)
RBC: 6.02 MIL/uL — ABNORMAL HIGH (ref 4.22–5.81)
RDW: 14.6 % (ref 11.5–15.5)
WBC: 12.4 10*3/uL — ABNORMAL HIGH (ref 4.0–10.5)
nRBC: 0 % (ref 0.0–0.2)

## 2020-10-30 LAB — PROTIME-INR
INR: 1 (ref 0.8–1.2)
Prothrombin Time: 13.1 seconds (ref 11.4–15.2)

## 2020-10-30 LAB — APTT: aPTT: 29 seconds (ref 24–36)

## 2020-10-30 LAB — SURGICAL PCR SCREEN
MRSA, PCR: NEGATIVE
Staphylococcus aureus: NEGATIVE

## 2020-10-30 NOTE — Progress Notes (Signed)
Patient notified of results & recommendations. Expressed understanding.

## 2020-10-30 NOTE — Progress Notes (Signed)
PCP - Dr. Marcy Siren Cardiologist - denies   Chest x-ray - 10/30/20 EKG - n/a Stress Test - denies ECHO - denies Cardiac Cath - denies  Sleep Study - 10/24/20; shows severe OSA. Pending clearance from PCP. Pt instructed to f/u with PCP or [facility] PA-C for the completion of clearance now that results are back.   Blood Thinner Instructions: n/a Aspirin Instructions:n/a  COVID TEST- Due to work schedule, pt scheduled for covid testing on 11/01/20 at 0800.    Anesthesia review: Yes, per order and completion of medical clearance pending sleep study results.   Patient denies shortness of breath, fever, cough and chest pain at PAT appointment   All instructions explained to the patient, with a verbal understanding of the material. Patient agrees to go over the instructions while at home for a better understanding. Patient also instructed to self quarantine after being tested for COVID-19. The opportunity to ask questions was provided.    Coronavirus Screening  Have you experienced the following symptoms:  Cough yes/no: No Fever (>100.1F)  yes/no: No Runny nose yes/no: No Sore throat yes/no: No Difficulty breathing/shortness of breath  yes/no: No  Have you or a family member traveled in the last 14 days and where? yes/no: No   If the patient indicates "YES" to the above questions, their PAT will be rescheduled to limit the exposure to others and, the surgeon will be notified. THE PATIENT WILL NEED TO BE ASYMPTOMATIC FOR 14 DAYS.   If the patient is not experiencing any of these symptoms, the PAT nurse will instruct them to NOT bring anyone with them to their appointment since they may have these symptoms or traveled as well.   Please remind your patients and families that hospital visitation restrictions are in effect and the importance of the restrictions.

## 2020-10-31 ENCOUNTER — Ambulatory Visit: Payer: Self-pay | Admitting: Orthopedic Surgery

## 2020-10-31 NOTE — H&P (Signed)
Subjective:   Nicholas Horne is a pleasant 49 year old male who initially presented To our office on 8/16/20221 with neck pain and radicular right arm pain as well as right arm weakness. Since then, patient has undergone appropriate conservative care including chiropractic care, innjection therapy, over-the-counter medications and prescription medications. Imaging studies do reveal significant stenosis and nerve compression that correlates with his symptoms. Patient got significant relief in his pain for approximately 2 weeks following a right-sided C7 selective nerve root block, unfortunately did can continue to have weakness and the results are only short lived. At this point the pain and weakness is affecting his quality-of-life and he would like to move forward with surgical intervention. He is scheduled for TDR C5-7 At [facility] On 11/02/20 With Dr. Shon Baton.  Patient Active Problem List   Diagnosis Date Noted  . OSA (obstructive sleep apnea) 10/30/2020  . Tobacco use disorder 09/29/2018  . Cocaine use disorder, moderate, dependence (HCC) 09/28/2018  . Suicidal ideation 09/28/2018  . Bipolar I disorder, most recent episode depressed, severe without psychotic features (HCC) 09/28/2018   Past Medical History:  Diagnosis Date  . Bipolar disorder (manic depression) (HCC)   . Hypoglycemia     Past Surgical History:  Procedure Laterality Date  . NASAL FRACTURE SURGERY  1993  . TONSILLECTOMY      Current Outpatient Medications  Medication Sig Dispense Refill Last Dose  . albuterol (VENTOLIN HFA) 108 (90 Base) MCG/ACT inhaler Inhale 2 puffs into the lungs every 6 (six) hours as needed for wheezing or shortness of breath (cough). 18 g 0   . Multiple Vitamin (MULTIVITAMIN WITH MINERALS) TABS tablet Take 3 tablets by mouth daily.     Marland Kitchen omeprazole (PRILOSEC) 40 MG capsule Take 1 capsule (40 mg total) by mouth 2 (two) times daily. 20 capsule 0   . pantoprazole (PROTONIX) 40 MG tablet Take 1 tablet (40 mg  total) by mouth 2 (two) times daily. (Patient not taking: Reported on 10/25/2020) 30 tablet 3   . TESTOSTERONE CYPIONATE IM Inject 250 mg into the muscle every Wednesday.     Marland Kitchen ZINC-MAGNESIUM ASPART-VIT B6 PO Take 3 tablets by mouth daily.      No current facility-administered medications for this visit.   Allergies  Allergen Reactions  . Abilify [Aripiprazole] Other (See Comments)    Suicidal thoughts  . Gabapentin     Felt funny    Social History   Tobacco Use  . Smoking status: Former Games developer  . Smokeless tobacco: Never Used  Substance Use Topics  . Alcohol use: No    Family History  Problem Relation Age of Onset  . Healthy Mother   . Diabetes Father     Review of Systems As stated in HPI  Objective:   Vitals: Ht: 5 ft 10.5 in 10/27/2020 07:52 am BP: 124/72 L arm 10/27/2020 07:56 am Pulse: 76 bpm 10/27/2020 07:58 am  General: AAOX3, well developed and well nourished, NAD Ambulation: normal gait pattern, uses no assistive device. Inspection: No obvious deformity Palpation: Tender over spinous processes and neck musculature.  AROM: -Significant pain with range of motion of cervical spine radiating to the right upper extremity -Shoulder, elbow, and wrists AROM normal and pain free.   Dermatomes: Radicular right arm pain in the C6 and C7 dermatome pattern  Myotomes:  - shoulder shrug: Left 5/5, Right 5/5 -Shoulder Abduction: Left 5/5, Right 5/5 - Elbow flexion: Left 5/5, Right 4+/5 - Elbow extension Left 5/5, Right 4+/5 - Wrist extension: Left 5/5, right  5/5 - Finger abduction: Left 5/5, Right 5/5 - finger Adduction/squeeze: Left 5/5, Right 5/5  Reflexes:  - Hoffman's: Negative  Special Tests: - UE Neural tension test: Left Negative, Right Positive  PV: Extremities warm and well profused. 2+ radial pulses bilaterally X-Ray impression: 4 view cervical x-ray was taken at outside facility and the images were personally reviewed by me as well as the report.  There is loss of disc space height most notable at C5-6 and C6-7. Normal cervical lordosis. No signs of fracture.      MRI Impression: MRI of lumbar spine was performed at [facility] dated July 13, 2020. Both images as well as report were personally reviewed by me. At C6-7 there is a right paracentral disc protrusion osteophyte complex and right arthrosis resulting in severe right neural foraminal stenosis with mild central canal stenosis and mild mass-effect on the right ventral cord. At C5-6 there is a mild disc bulge with bilateral arthrosis resulting in bilateral neural foraminal stenosis at the severe nature. At C3-4 and C4-5 there is left arthrosis resulting in severe left neural foraminal stenosis as well.  Patient reports 2 weeks of significant relief following right C7 selective nerve root block  Assessment:   [name] is a pleasant 49 year old male who initially presented To our office on 8/16/20221 with neck pain and radicular right arm pain as well as right arm weakness. Since then, patient has undergone appropriate conservative care including chiropractic care, innjection therapy, over-the-counter medications and prescription medications. Imaging studies do reveal significant stenosis and nerve compression that correlates with his symptoms. Patient got significant relief in his pain for approximately 2 weeks following a right-sided C7 selective nerve root block, unfortunately did can continue to have weakness and the results are only short lived. His clinical exam seems to demonstrate progressive motor weakness as well as dysesthesias and radicular arm pain. 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 At this point despite Appropriate conservative care he continues to have severe neuropathic pain in neurological deficits. After discussing risks and benefits of surgical intervention, patient has elected to move forward with  surgical intervention.  Plan:    Recommended moving forward with a 2 level disc replacement. I have gone over the surgical procedure in great detail including the risks and benefits.Risks and benefits of surgery were discussed with the patient. These include: Infection, bleeding, death, stroke, paralysis, ongoing or worse pain, need for additional surgery, nonunion, leak of spinal fluid, adjacent segment degeneration requiring additional fusion surgery. Pseudoarthrosis (nonunion)requiring supplemental posterior fixation. Throat pain, swallowing difficulties, hoarseness or change in voice.   With respect to disc replacement: Additional risks include heterotopic ossification, inability to place the disc due to technical issues requiring bailout to a fusion procedure.   All of his questions were encouraged and addressed.   At this point, we are still waiting parents from the patient's primary care provider. The primary care provider has ordered a home sleep study which the patient has performance, however these results have not yet been read. The results are scheduled to be read this Saturday and we should have more information regarding the clearance on Monday. We will plan to move forward with the surgery as scheduled pending clearance on Monday, if the patient is not clear due to sleep study results and we will plan on postponing the surgery.  I did review the patient's medication list with him. He is not on any blood thinners, he  is not using any aspirin products, he is not using any anti-inflammatory products. I did inform the patient that leading up to surgery and in the postoperative period he is to avoid any anti-inflammatory medications or aspirin products and he did express understanding of this. He does take a daily vitamin which I will have him hold 1 week prior to surgery as well.  We have also discussed the post-operative recovery period to include: bathing/showering restrictions, wound  healing, activity (and driving) restrictions, medications/pain mangement.  We have also discussed post-operative redflags to include: signs and symptoms of postoperative infection, DVT/PE.  Patient was fitted with a soft cervical collar at today's office visit.

## 2020-10-31 NOTE — Anesthesia Preprocedure Evaluation (Addendum)
Anesthesia Evaluation  Patient identified by MRN, date of birth, ID band Patient awake    Reviewed: Allergy & Precautions, NPO status , Patient's Chart, lab work & pertinent test results  Airway Mallampati: III  TM Distance: >3 FB Neck ROM: Full    Dental no notable dental hx. (+) Teeth Intact, Dental Advisory Given   Pulmonary sleep apnea , former smoker,    Pulmonary exam normal breath sounds clear to auscultation       Cardiovascular Exercise Tolerance: Good negative cardio ROS Normal cardiovascular exam Rhythm:Regular Rate:Normal     Neuro/Psych PSYCHIATRIC DISORDERS Bipolar Disorder    GI/Hepatic Neg liver ROS, (+)     substance abuse (hx)  cocaine use,   Endo/Other  negative endocrine ROS  Renal/GU K+ 3.9 Cr 1.18     Musculoskeletal negative musculoskeletal ROS (+)   Abdominal   Peds  Hematology hgb 17,5   Anesthesia Other Findings   Reproductive/Obstetrics                           Anesthesia Physical Anesthesia Plan  ASA: II  Anesthesia Plan: General   Post-op Pain Management:    Induction: Intravenous  PONV Risk Score and Plan: 3 and Treatment may vary due to age or medical condition, Ondansetron, Dexamethasone and Midazolam  Airway Management Planned: Oral ETT and Video Laryngoscope Planned  Additional Equipment: None  Intra-op Plan:   Post-operative Plan: Extubation in OR  Informed Consent: I have reviewed the patients History and Physical, chart, labs and discussed the procedure including the risks, benefits and alternatives for the proposed anesthesia with the patient or authorized representative who has indicated his/her understanding and acceptance.     Dental advisory given  Plan Discussed with: CRNA and Anesthesiologist  Anesthesia Plan Comments: (: Patient recently had sleep study ordered by PCP Dr. Marcy Siren.  Home sleep test was done  10/24/2020 showed severe obstructive sleep apnea with AHI of 80/h.  No significant central sleep apnea.  Mean O2 sat 89%.  CPAP titration suggested, has not been completed yet.  Patient was subsequently cleared by primary care provider to undergo surgery, copy on chart.  Preop labs reviewed, unremarkable.   EKG 07/06/2019: Sinus rhythm with occasional PVCs.  Rate 68.  CHEST - 2 VIEW 10/30/2020:  COMPARISON:  07/06/2019 chest radiograph and prior.  FINDINGS: The heart size and mediastinal contours are within normal limits. Both lungs are clear. No pneumothorax or pleural effusion. Multilevel spondylosis.  IMPRESSION: No focal airspace disease.PAT note by Antionette Poles, PA-C )     Anesthesia Quick Evaluation

## 2020-10-31 NOTE — Progress Notes (Addendum)
Anesthesia Chart Review:  Patient recently had sleep study ordered by PCP Dr. Marcy Siren.  Home sleep test was done 10/24/2020 showed severe obstructive sleep apnea with AHI of 80/h.  No significant central sleep apnea.  Mean O2 sat 89%.  CPAP titration suggested, has not been completed yet.  Patient was subsequently cleared by primary care provider to undergo surgery, copy on chart.  Preop labs reviewed, unremarkable.   EKG 07/06/2019: Sinus rhythm with occasional PVCs.  Rate 68.  CHEST - 2 VIEW 10/30/2020:  COMPARISON:  07/06/2019 chest radiograph and prior.  FINDINGS: The heart size and mediastinal contours are within normal limits. Both lungs are clear. No pneumothorax or pleural effusion. Multilevel spondylosis.  IMPRESSION: No focal airspace disease.   Nicholas Horne [facility] Short Stay Center/Anesthesiology Phone [phone] 10/31/2020 11:40 AM

## 2020-11-01 ENCOUNTER — Other Ambulatory Visit (HOSPITAL_COMMUNITY)
Admission: RE | Admit: 2020-11-01 | Discharge: 2020-11-01 | Disposition: A | Discharge status: Home / Self Care | Payer: BLUE CROSS/BLUE SHIELD | Source: Ambulatory Visit | Attending: Orthopedic Surgery | Admitting: Orthopedic Surgery

## 2020-11-01 ENCOUNTER — Telehealth: Payer: BLUE CROSS/BLUE SHIELD | Admitting: Internal Medicine

## 2020-11-01 DIAGNOSIS — Z01812 Encounter for preprocedural laboratory examination: Secondary | ICD-10-CM | POA: Insufficient documentation

## 2020-11-01 DIAGNOSIS — Z20822 Contact with and (suspected) exposure to covid-19: Secondary | ICD-10-CM | POA: Diagnosis not present

## 2020-11-01 LAB — SARS CORONAVIRUS 2 (TAT 6-24 HRS): SARS Coronavirus 2: NEGATIVE

## 2020-11-02 ENCOUNTER — Encounter (HOSPITAL_COMMUNITY)
Admission: RE | Disposition: A | Discharge status: Home / Self Care | Payer: Self-pay | Source: Ambulatory Visit | Attending: Orthopedic Surgery

## 2020-11-02 ENCOUNTER — Observation Stay (HOSPITAL_COMMUNITY)
Admission: RE | Admit: 2020-11-02 | Discharge: 2020-11-03 | Disposition: A | Discharge status: Home / Self Care | Payer: Worker's Compensation | Source: Ambulatory Visit | Attending: Orthopedic Surgery | Admitting: Orthopedic Surgery

## 2020-11-02 ENCOUNTER — Ambulatory Visit (HOSPITAL_COMMUNITY): Payer: Worker's Compensation | Admitting: Anesthesiology

## 2020-11-02 ENCOUNTER — Ambulatory Visit (HOSPITAL_COMMUNITY): Payer: Worker's Compensation | Admitting: Physician Assistant

## 2020-11-02 ENCOUNTER — Other Ambulatory Visit: Payer: Self-pay

## 2020-11-02 ENCOUNTER — Ambulatory Visit (HOSPITAL_COMMUNITY): Payer: Worker's Compensation

## 2020-11-02 ENCOUNTER — Encounter (HOSPITAL_COMMUNITY): Payer: Self-pay | Admitting: Orthopedic Surgery

## 2020-11-02 DIAGNOSIS — M4722 Other spondylosis with radiculopathy, cervical region: Secondary | ICD-10-CM | POA: Insufficient documentation

## 2020-11-02 DIAGNOSIS — M50123 Cervical disc disorder at C6-C7 level with radiculopathy: Secondary | ICD-10-CM | POA: Insufficient documentation

## 2020-11-02 DIAGNOSIS — Z419 Encounter for procedure for purposes other than remedying health state, unspecified: Secondary | ICD-10-CM

## 2020-11-02 DIAGNOSIS — Z0389 Encounter for observation for other suspected diseases and conditions ruled out: Secondary | ICD-10-CM | POA: Diagnosis not present

## 2020-11-02 DIAGNOSIS — M50122 Cervical disc disorder at C5-C6 level with radiculopathy: Secondary | ICD-10-CM | POA: Diagnosis not present

## 2020-11-02 DIAGNOSIS — M5412 Radiculopathy, cervical region: Secondary | ICD-10-CM | POA: Diagnosis not present

## 2020-11-02 DIAGNOSIS — M5020 Other cervical disc displacement, unspecified cervical region: Secondary | ICD-10-CM | POA: Diagnosis present

## 2020-11-02 HISTORY — PX: CERVICAL DISC ARTHROPLASTY: SHX587

## 2020-11-02 LAB — GLUCOSE, CAPILLARY
Glucose-Capillary: 129 mg/dL — ABNORMAL HIGH (ref 70–99)
Glucose-Capillary: 145 mg/dL — ABNORMAL HIGH (ref 70–99)

## 2020-11-02 SURGERY — CERVICAL ANTERIOR DISC ARTHROPLASTY
Anesthesia: General | Site: Spine Cervical

## 2020-11-02 MED ORDER — THROMBIN 20000 UNITS EX SOLR
CUTANEOUS | Status: AC
  Filled 2020-11-02: qty 20000

## 2020-11-02 MED ORDER — PHENYLEPHRINE HCL-NACL 10-0.9 MG/250ML-% IV SOLN
INTRAVENOUS | Status: DC | PRN
  Administered 2020-11-02: 30 ug/min via INTRAVENOUS

## 2020-11-02 MED ORDER — LACTATED RINGERS IV SOLN: INTRAVENOUS | Status: DC

## 2020-11-02 MED ORDER — THROMBIN 20000 UNITS EX SOLR
CUTANEOUS | Status: DC | PRN
  Administered 2020-11-02: 20000 [IU] via TOPICAL

## 2020-11-02 MED ORDER — AMISULPRIDE (ANTIEMETIC) 5 MG/2ML IV SOLN: 10.0000 mg | Freq: Once | INTRAVENOUS | Status: DC | PRN

## 2020-11-02 MED ORDER — ORAL CARE MOUTH RINSE: 15.0000 mL | Freq: Once | OROMUCOSAL | Status: AC

## 2020-11-02 MED ORDER — ACETAMINOPHEN 10 MG/ML IV SOLN: 1000.0000 mg | Freq: Once | INTRAVENOUS | Status: DC | PRN

## 2020-11-02 MED ORDER — DEXMEDETOMIDINE (PRECEDEX) IN NS 20 MCG/5ML (4 MCG/ML) IV SYRINGE
PREFILLED_SYRINGE | INTRAVENOUS | Status: AC
  Filled 2020-11-02: qty 5

## 2020-11-02 MED ORDER — OXYCODONE HCL 5 MG PO TABS: 5.0000 mg | ORAL_TABLET | ORAL | Status: DC | PRN

## 2020-11-02 MED ORDER — METHOCARBAMOL 500 MG PO TABS
ORAL_TABLET | ORAL | Status: AC
  Filled 2020-11-02: qty 1

## 2020-11-02 MED ORDER — ACETAMINOPHEN 10 MG/ML IV SOLN
INTRAVENOUS | Status: DC | PRN
  Administered 2020-11-02: 1000 mg via INTRAVENOUS

## 2020-11-02 MED ORDER — FENTANYL CITRATE (PF) 100 MCG/2ML IJ SOLN
INTRAMUSCULAR | Status: DC | PRN
  Administered 2020-11-02: 100 ug via INTRAVENOUS
  Administered 2020-11-02 (×2): 50 ug via INTRAVENOUS

## 2020-11-02 MED ORDER — ONDANSETRON HCL 4 MG/2ML IJ SOLN
INTRAMUSCULAR | Status: AC
  Filled 2020-11-02: qty 2

## 2020-11-02 MED ORDER — HYDROMORPHONE HCL 1 MG/ML IJ SOLN
0.2500 mg | INTRAMUSCULAR | Status: DC | PRN
  Administered 2020-11-02: 0.5 mg via INTRAVENOUS

## 2020-11-02 MED ORDER — ALUM & MAG HYDROXIDE-SIMETH 200-200-20 MG/5ML PO SUSP
30.0000 mL | Freq: Four times a day (QID) | ORAL | Status: DC | PRN
  Filled 2020-11-02 (×2): qty 30

## 2020-11-02 MED ORDER — 0.9 % SODIUM CHLORIDE (POUR BTL) OPTIME
TOPICAL | Status: DC | PRN
  Administered 2020-11-02: 1000 mL

## 2020-11-02 MED ORDER — MENTHOL 3 MG MT LOZG: 1.0000 | LOZENGE | OROMUCOSAL | Status: DC | PRN

## 2020-11-02 MED ORDER — ACETAMINOPHEN 325 MG PO TABS: 650.0000 mg | ORAL_TABLET | ORAL | Status: DC | PRN

## 2020-11-02 MED ORDER — DEXMEDETOMIDINE (PRECEDEX) IN NS 20 MCG/5ML (4 MCG/ML) IV SYRINGE
PREFILLED_SYRINGE | INTRAVENOUS | Status: DC | PRN
  Administered 2020-11-02 (×2): 8 ug via INTRAVENOUS
  Administered 2020-11-02: 4 ug via INTRAVENOUS

## 2020-11-02 MED ORDER — MAGNESIUM HYDROXIDE 400 MG/5ML PO SUSP: 30.0000 mL | Freq: Every day | ORAL | Status: DC | PRN

## 2020-11-02 MED ORDER — DEXAMETHASONE SODIUM PHOSPHATE 10 MG/ML IJ SOLN
INTRAMUSCULAR | Status: DC | PRN
  Administered 2020-11-02: 4 mg via INTRAVENOUS

## 2020-11-02 MED ORDER — ACETAMINOPHEN 650 MG RE SUPP: 650.0000 mg | RECTAL | Status: DC | PRN

## 2020-11-02 MED ORDER — KETAMINE HCL 10 MG/ML IJ SOLN
INTRAMUSCULAR | Status: DC | PRN
  Administered 2020-11-02: 40 mg via INTRAVENOUS
  Administered 2020-11-02: 10 mg via INTRAVENOUS

## 2020-11-02 MED ORDER — METHOCARBAMOL 500 MG PO TABS: 500.0000 mg | ORAL_TABLET | Freq: Three times a day (TID) | ORAL | 0 refills | Status: AC | PRN

## 2020-11-02 MED ORDER — METHOCARBAMOL 500 MG PO TABS
500.0000 mg | ORAL_TABLET | Freq: Four times a day (QID) | ORAL | Status: DC | PRN
  Administered 2020-11-02 – 2020-11-03 (×3): 500 mg via ORAL
  Filled 2020-11-02 (×2): qty 1

## 2020-11-02 MED ORDER — PANTOPRAZOLE SODIUM 40 MG PO TBEC
40.0000 mg | DELAYED_RELEASE_TABLET | Freq: Two times a day (BID) | ORAL | Status: DC
  Administered 2020-11-02 – 2020-11-03 (×2): 40 mg via ORAL
  Filled 2020-11-02: qty 1

## 2020-11-02 MED ORDER — LIDOCAINE 2% (20 MG/ML) 5 ML SYRINGE
INTRAMUSCULAR | Status: DC | PRN
  Administered 2020-11-02: 100 mg via INTRAVENOUS

## 2020-11-02 MED ORDER — METHOCARBAMOL 1000 MG/10ML IJ SOLN
500.0000 mg | Freq: Four times a day (QID) | INTRAVENOUS | Status: DC | PRN
  Filled 2020-11-02: qty 5

## 2020-11-02 MED ORDER — ONDANSETRON HCL 4 MG/2ML IJ SOLN: 4.0000 mg | Freq: Four times a day (QID) | INTRAMUSCULAR | Status: DC | PRN

## 2020-11-02 MED ORDER — ALBUTEROL SULFATE HFA 108 (90 BASE) MCG/ACT IN AERS
2.0000 | INHALATION_SPRAY | Freq: Four times a day (QID) | RESPIRATORY_TRACT | Status: DC | PRN
  Filled 2020-11-02: qty 6.7

## 2020-11-02 MED ORDER — ROCURONIUM BROMIDE 10 MG/ML (PF) SYRINGE
PREFILLED_SYRINGE | INTRAVENOUS | Status: DC | PRN
  Administered 2020-11-02: 10 mg via INTRAVENOUS
  Administered 2020-11-02: 15 mg via INTRAVENOUS
  Administered 2020-11-02: 70 mg via INTRAVENOUS
  Administered 2020-11-02: 20 mg via INTRAVENOUS
  Administered 2020-11-02: 30 mg via INTRAVENOUS

## 2020-11-02 MED ORDER — ROCURONIUM BROMIDE 10 MG/ML (PF) SYRINGE
PREFILLED_SYRINGE | INTRAVENOUS | Status: AC
  Filled 2020-11-02: qty 20

## 2020-11-02 MED ORDER — ONDANSETRON HCL 4 MG PO TABS
4.0000 mg | ORAL_TABLET | Freq: Four times a day (QID) | ORAL | Status: DC | PRN
  Administered 2020-11-03: 4 mg via ORAL
  Filled 2020-11-02: qty 1

## 2020-11-02 MED ORDER — ONDANSETRON HCL 4 MG/2ML IJ SOLN
INTRAMUSCULAR | Status: DC | PRN
  Administered 2020-11-02: 4 mg via INTRAVENOUS

## 2020-11-02 MED ORDER — TRANEXAMIC ACID-NACL 1000-0.7 MG/100ML-% IV SOLN
INTRAVENOUS | Status: AC
  Filled 2020-11-02: qty 100

## 2020-11-02 MED ORDER — FENTANYL CITRATE (PF) 250 MCG/5ML IJ SOLN
INTRAMUSCULAR | Status: AC
  Filled 2020-11-02: qty 5

## 2020-11-02 MED ORDER — SUGAMMADEX SODIUM 200 MG/2ML IV SOLN
INTRAVENOUS | Status: DC | PRN
  Administered 2020-11-02: 200 mg via INTRAVENOUS

## 2020-11-02 MED ORDER — MIDAZOLAM HCL 2 MG/2ML IJ SOLN
INTRAMUSCULAR | Status: AC
  Filled 2020-11-02: qty 2

## 2020-11-02 MED ORDER — PROPOFOL 10 MG/ML IV BOLUS
INTRAVENOUS | Status: DC | PRN
  Administered 2020-11-02: 170 mg via INTRAVENOUS

## 2020-11-02 MED ORDER — ACETAMINOPHEN 10 MG/ML IV SOLN
INTRAVENOUS | Status: AC
  Filled 2020-11-02: qty 100

## 2020-11-02 MED ORDER — BUPIVACAINE-EPINEPHRINE (PF) 0.25% -1:200000 IJ SOLN
INTRAMUSCULAR | Status: AC
  Filled 2020-11-02: qty 10

## 2020-11-02 MED ORDER — CEFAZOLIN SODIUM-DEXTROSE 2-4 GM/100ML-% IV SOLN
2.0000 g | INTRAVENOUS | Status: AC
  Administered 2020-11-02: 2 g via INTRAVENOUS
  Filled 2020-11-02: qty 100

## 2020-11-02 MED ORDER — SODIUM CHLORIDE 0.9% FLUSH: 3.0000 mL | INTRAVENOUS | Status: DC | PRN

## 2020-11-02 MED ORDER — ONDANSETRON HCL 4 MG/2ML IJ SOLN
4.0000 mg | Freq: Once | INTRAMUSCULAR | Status: AC | PRN
  Administered 2020-11-02: 4 mg via INTRAVENOUS

## 2020-11-02 MED ORDER — BUPIVACAINE-EPINEPHRINE (PF) 0.25% -1:200000 IJ SOLN
INTRAMUSCULAR | Status: AC
  Filled 2020-11-02: qty 20

## 2020-11-02 MED ORDER — MIDAZOLAM HCL 5 MG/5ML IJ SOLN
INTRAMUSCULAR | Status: DC | PRN
  Administered 2020-11-02: 2 mg via INTRAVENOUS

## 2020-11-02 MED ORDER — SODIUM CHLORIDE 0.9 % IV SOLN: 250.0000 mL | INTRAVENOUS | Status: DC

## 2020-11-02 MED ORDER — PHENYLEPHRINE 40 MCG/ML (10ML) SYRINGE FOR IV PUSH (FOR BLOOD PRESSURE SUPPORT)
PREFILLED_SYRINGE | INTRAVENOUS | Status: AC
  Filled 2020-11-02: qty 10

## 2020-11-02 MED ORDER — OXYCODONE HCL 5 MG PO TABS
10.0000 mg | ORAL_TABLET | ORAL | Status: DC | PRN
  Administered 2020-11-02 – 2020-11-03 (×5): 10 mg via ORAL
  Filled 2020-11-02 (×5): qty 2

## 2020-11-02 MED ORDER — CHLORHEXIDINE GLUCONATE 0.12 % MT SOLN: 15.0000 mL | Freq: Once | OROMUCOSAL | Status: AC

## 2020-11-02 MED ORDER — HYDROMORPHONE HCL 1 MG/ML IJ SOLN
INTRAMUSCULAR | Status: AC
  Filled 2020-11-02: qty 1

## 2020-11-02 MED ORDER — BUPIVACAINE-EPINEPHRINE 0.25% -1:200000 IJ SOLN
INTRAMUSCULAR | Status: DC | PRN
  Administered 2020-11-02: 5 mL

## 2020-11-02 MED ORDER — DOCUSATE SODIUM 100 MG PO CAPS
100.0000 mg | ORAL_CAPSULE | Freq: Two times a day (BID) | ORAL | Status: DC
  Administered 2020-11-02: 100 mg via ORAL
  Filled 2020-11-02: qty 1

## 2020-11-02 MED ORDER — PROPOFOL 10 MG/ML IV BOLUS
INTRAVENOUS | Status: AC
  Filled 2020-11-02: qty 20

## 2020-11-02 MED ORDER — ONDANSETRON HCL 4 MG PO TABS: 4.0000 mg | ORAL_TABLET | Freq: Three times a day (TID) | ORAL | 0 refills | Status: DC | PRN

## 2020-11-02 MED ORDER — KETAMINE HCL 50 MG/5ML IJ SOSY
PREFILLED_SYRINGE | INTRAMUSCULAR | Status: AC
  Filled 2020-11-02: qty 5

## 2020-11-02 MED ORDER — CEFAZOLIN SODIUM-DEXTROSE 1-4 GM/50ML-% IV SOLN
1.0000 g | Freq: Three times a day (TID) | INTRAVENOUS | Status: AC
  Administered 2020-11-02 (×2): 1 g via INTRAVENOUS
  Filled 2020-11-02 (×2): qty 50

## 2020-11-02 MED ORDER — MEPERIDINE HCL 25 MG/ML IJ SOLN: 6.2500 mg | INTRAMUSCULAR | Status: DC | PRN

## 2020-11-02 MED ORDER — PHENYLEPHRINE HCL (PRESSORS) 10 MG/ML IV SOLN
INTRAVENOUS | Status: AC
  Filled 2020-11-02: qty 1

## 2020-11-02 MED ORDER — OXYCODONE HCL 5 MG/5ML PO SOLN: 5.0000 mg | Freq: Once | ORAL | Status: DC | PRN

## 2020-11-02 MED ORDER — CHLORHEXIDINE GLUCONATE 0.12 % MT SOLN
OROMUCOSAL | Status: AC
  Administered 2020-11-02: 15 mL via OROMUCOSAL
  Filled 2020-11-02: qty 15

## 2020-11-02 MED ORDER — HEMOSTATIC AGENTS (NO CHARGE) OPTIME
TOPICAL | Status: DC | PRN
  Administered 2020-11-02 (×2): 1

## 2020-11-02 MED ORDER — SODIUM CHLORIDE 0.9% FLUSH
3.0000 mL | Freq: Two times a day (BID) | INTRAVENOUS | Status: DC
  Administered 2020-11-02: 3 mL via INTRAVENOUS

## 2020-11-02 MED ORDER — TRANEXAMIC ACID-NACL 1000-0.7 MG/100ML-% IV SOLN
INTRAVENOUS | Status: DC | PRN
  Administered 2020-11-02: 1000 mg via INTRAVENOUS

## 2020-11-02 MED ORDER — PHENYLEPHRINE HCL (PRESSORS) 10 MG/ML IV SOLN
INTRAVENOUS | Status: DC | PRN
  Administered 2020-11-02 (×3): 80 ug via INTRAVENOUS

## 2020-11-02 MED ORDER — PHENOL 1.4 % MT LIQD: 1.0000 | OROMUCOSAL | Status: DC | PRN

## 2020-11-02 MED ORDER — OXYCODONE HCL 5 MG PO TABS: 5.0000 mg | ORAL_TABLET | Freq: Once | ORAL | Status: DC | PRN

## 2020-11-02 MED ORDER — OXYCODONE-ACETAMINOPHEN 10-325 MG PO TABS: 1.0000 | ORAL_TABLET | Freq: Four times a day (QID) | ORAL | 0 refills | Status: AC | PRN

## 2020-11-02 SURGICAL SUPPLY — 62 items
AGENT HMST KT MTR STRL THRMB (HEMOSTASIS) ×1
BIT DRILL FLUTELESS (BIT) ×1 IMPLANT
BIT DRILL NEURO 2X3.1 SFT TUCH (MISCELLANEOUS) IMPLANT
BLADE CLIPPER SURG (BLADE) IMPLANT
CAGE PRESTIGE 6X14 (Cage) ×1 IMPLANT
CAGE PRESTIGE LP 5X14 (Cage) ×1 IMPLANT
CANISTER SUCT 3000ML PPV (MISCELLANEOUS) ×2 IMPLANT
CLSR STERI-STRIP ANTIMIC 1/2X4 (GAUZE/BANDAGES/DRESSINGS) ×2 IMPLANT
COVER MAYO STAND STRL (DRAPES) ×7 IMPLANT
COVER SURGICAL LIGHT HANDLE (MISCELLANEOUS) ×4 IMPLANT
COVER WAND RF STERILE (DRAPES) ×2 IMPLANT
DRAPE C-ARM 42X72 X-RAY (DRAPES) ×2 IMPLANT
DRAPE C-ARMOR (DRAPES) IMPLANT
DRAPE POUCH INSTRU U-SHP 10X18 (DRAPES) ×2 IMPLANT
DRAPE SURG 17X23 STRL (DRAPES) ×2 IMPLANT
DRAPE U-SHAPE 47X51 STRL (DRAPES) ×2 IMPLANT
DRILL NEURO 2X3.1 SOFT TOUCH (MISCELLANEOUS) ×2
DRSG OPSITE 4X5.5 SM (GAUZE/BANDAGES/DRESSINGS) ×1 IMPLANT
DRSG OPSITE POSTOP 3X4 (GAUZE/BANDAGES/DRESSINGS) ×2 IMPLANT
DURAPREP 26ML APPLICATOR (WOUND CARE) ×2 IMPLANT
ELECT COATED BLADE 2.86 ST (ELECTRODE) ×2 IMPLANT
ELECT PENCIL ROCKER SW 15FT (MISCELLANEOUS) ×2 IMPLANT
ELECT REM PT RETURN 9FT ADLT (ELECTROSURGICAL) ×2
ELECTRODE REM PT RTRN 9FT ADLT (ELECTROSURGICAL) ×1 IMPLANT
GAUZE SPONGE 4X4 12PLY STRL LF (GAUZE/BANDAGES/DRESSINGS) ×1 IMPLANT
GLOVE BIO SURGEON STRL SZ 6.5 (GLOVE) ×2 IMPLANT
GLOVE BIOGEL PI IND STRL 6.5 (GLOVE) ×1 IMPLANT
GLOVE BIOGEL PI IND STRL 8.5 (GLOVE) ×1 IMPLANT
GLOVE BIOGEL PI INDICATOR 6.5 (GLOVE) ×1
GLOVE BIOGEL PI INDICATOR 8.5 (GLOVE) ×1
GLOVE SS BIOGEL STRL SZ 8.5 (GLOVE) ×1 IMPLANT
GLOVE SUPERSENSE BIOGEL SZ 8.5 (GLOVE) ×1
GOWN STRL REUS W/ TWL LRG LVL3 (GOWN DISPOSABLE) ×2 IMPLANT
GOWN STRL REUS W/TWL 2XL LVL3 (GOWN DISPOSABLE) ×2 IMPLANT
GOWN STRL REUS W/TWL LRG LVL3 (GOWN DISPOSABLE) ×4
KIT BASIN OR (CUSTOM PROCEDURE TRAY) ×2 IMPLANT
KIT TURNOVER KIT B (KITS) ×2 IMPLANT
NDL SPNL 18GX3.5 QUINCKE PK (NEEDLE) ×1 IMPLANT
NEEDLE SPNL 18GX3.5 QUINCKE PK (NEEDLE) ×2 IMPLANT
NS IRRIG 1000ML POUR BTL (IV SOLUTION) ×2 IMPLANT
PACK ORTHO CERVICAL (CUSTOM PROCEDURE TRAY) ×2 IMPLANT
PACK UNIVERSAL I (CUSTOM PROCEDURE TRAY) ×2 IMPLANT
PAD ARMBOARD 7.5X6 YLW CONV (MISCELLANEOUS) ×4 IMPLANT
PIN DISTRACTION 14 (PIN) ×3 IMPLANT
POSITIONER HEAD DONUT 9IN (MISCELLANEOUS) ×2 IMPLANT
RESTRAINT LIMB HOLDER UNIV (RESTRAINTS) ×2 IMPLANT
SLEEVE SURGEON STRL (DRAPES) ×1 IMPLANT
SPONGE INTESTINAL PEANUT (DISPOSABLE) IMPLANT
SPONGE SURGIFOAM ABS GEL SZ50 (HEMOSTASIS) ×2 IMPLANT
SURGIFLO W/THROMBIN 8M KIT (HEMOSTASIS) ×2 IMPLANT
SUT BONE WAX W31G (SUTURE) ×2 IMPLANT
SUT MNCRL AB 3-0 PS2 27 (SUTURE) ×2 IMPLANT
SUT SILK 3 0 TIES 17X18 (SUTURE)
SUT SILK 3-0 18XBRD TIE BLK (SUTURE) IMPLANT
SUT VIC AB 2-0 CT1 18 (SUTURE) ×2 IMPLANT
SYR BULB IRRIG 60ML STRL (SYRINGE) ×2 IMPLANT
SYR CONTROL 10ML LL (SYRINGE) IMPLANT
TAPE CLOTH 4X10 WHT NS (GAUZE/BANDAGES/DRESSINGS) ×2 IMPLANT
TAPE UMBILICAL COTTON 1/8X30 (MISCELLANEOUS) ×2 IMPLANT
TOWEL GREEN STERILE (TOWEL DISPOSABLE) ×2 IMPLANT
TOWEL GREEN STERILE FF (TOWEL DISPOSABLE) ×2 IMPLANT
WATER STERILE IRR 1000ML POUR (IV SOLUTION) ×2 IMPLANT

## 2020-11-02 NOTE — Anesthesia Procedure Notes (Signed)
Procedure Name: Intubation Date/Time: 11/02/2020 7:53 AM Performed by: Epifanio Lesches, CRNA Pre-anesthesia Checklist: Patient identified, Emergency Drugs available, Suction available and Patient being monitored Patient Re-evaluated:Patient Re-evaluated prior to induction Oxygen Delivery Method: Circle System Utilized Preoxygenation: Pre-oxygenation with 100% oxygen Induction Type: IV induction Ventilation: Mask ventilation without difficulty and Oral airway inserted - appropriate to patient size Laryngoscope Size: Glidescope and 4 Grade View: Grade I Tube type: Oral Tube size: 7.5 mm Number of attempts: 1 Airway Equipment and Method: Stylet and Oral airway Placement Confirmation: ETT inserted through vocal cords under direct vision,  positive ETCO2 and breath sounds checked- equal and bilateral Secured at: 23 cm Tube secured with: Tape Dental Injury: Teeth and Oropharynx as per pre-operative assessment  Difficulty Due To: Difficult Airway- due to limited oral opening and Difficult Airway- due to large tongue Comments: Elective glidescope intubation due to cervical stenosis and small mouth with large tongue.

## 2020-11-02 NOTE — Evaluation (Signed)
Physical Therapy Evaluation Patient Details Name: Nicholas Horne MRN: [id number] DOB: [birth year] Today's Date: 11/02/2020   History of Present Illness  49 yo male s/p C5-7 cervical disc replacement for cervical radiculopathy. PMH includes OSA, cocaine and tobacco use, bipolar I.  Clinical Impression   Pt presents with post-surgical neck pain, decreased knowledge of cervical precautions, increased time and effort to mobilize, and decreased activity tolerance. Pt to benefit from acute PT to address deficits. Pt ambulated hallway distance without AD and supervision level of assist, pt limited by pain and nausea . No PT needs anticipated post-acutely, pt's wife at bedside and reports she will be available to assist pt at d/c. PT to progress mobility as tolerated, and will continue to follow acutely.      Follow Up Recommendations No PT follow up;Supervision for mobility/OOB    Equipment Recommendations  None recommended by PT    Recommendations for Other Services       Precautions / Restrictions Precautions Precautions: Cervical Precaution Booklet Issued: Yes (comment) Precaution Comments: No bending, lifting, twisting, arching, reaching above shoulder height Required Braces or Orthoses: Cervical Brace Cervical Brace: Soft collar;Other (comment) (can be applied in sitting) Restrictions Weight Bearing Restrictions: No      Mobility  Bed Mobility Overal bed mobility: Needs Assistance Bed Mobility: Rolling;Sidelying to Sit;Sit to Sidelying Rolling: Supervision Sidelying to sit: Supervision     Sit to sidelying: Supervision General bed mobility comments: for safety, verbal cuing for log roll technique    Transfers Overall transfer level: Needs assistance Equipment used: None Transfers: Sit to/from Stand Sit to Stand: Supervision         General transfer comment: for safety, slow to rise and steady  Ambulation/Gait   Gait Distance (Feet): 300 Feet Assistive device:  None Gait Pattern/deviations: Step-through pattern;Decreased stride length;Wide base of support Gait velocity: decr   General Gait Details: supervision for safety, verbal cuing for turning as a unit as opposed to leading with neck/back and hallway navigation.  Stairs            Wheelchair Mobility    Modified Rankin (Stroke Patients Only)       Balance Overall balance assessment: Mild deficits observed, not formally tested                                           Pertinent Vitals/Pain Pain Assessment: 0-10 Pain Score: 7  Pain Location: neck Pain Descriptors / Indicators: Sore;Discomfort;Grimacing Pain Intervention(s): Limited activity within patient's tolerance;Monitored during session;Repositioned    Home Living Family/patient expects to be discharged to:: Private residence Living Arrangements: Spouse/significant other;Children Available Help at Discharge: Family;Available 24 hours/day Type of Home: House Home Access: Stairs to enter   [employer] of Steps: 4 Home Layout: One level Home Equipment: None      Prior Function Level of Independence: Independent         Comments: works as a Nutritional therapist, 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   Dominant Hand: Right    Extremity/Trunk Assessment   Upper Extremity Assessment Upper Extremity Assessment: Defer to OT evaluation    Lower Extremity Assessment Lower Extremity Assessment: Overall WFL for tasks assessed    Cervical / Trunk Assessment Cervical / Trunk Assessment: Normal  Communication   Communication: No difficulties  Cognition Arousal/Alertness: Awake/alert Behavior During Therapy: WFL for tasks assessed/performed  Overall Cognitive Status: Within Functional Limits for tasks assessed                                 General Comments: pt reports feeling groggy and nauseous secondary to pain meds      General Comments       Exercises     Assessment/Plan    PT Assessment Patient needs continued PT services  PT Problem List Decreased strength;Decreased mobility;Decreased safety awareness;Decreased knowledge of precautions;Decreased activity tolerance;Decreased balance;Pain       PT Treatment Interventions DME instruction;Therapeutic activities;Gait training;Therapeutic exercise;Patient/family education;Balance training;Functional mobility training    PT Goals (Current goals can be found in the Care Plan section)  Acute Rehab PT Goals Patient Stated Goal: go home PT Goal Formulation: With patient Time For Goal Achievement: 11/16/20 Potential to Achieve Goals: Good    Frequency Min 5X/week   Barriers to discharge        Co-evaluation               AM-PAC PT "6 Clicks" Mobility  Outcome Measure Help needed turning from your back to your side while in a flat bed without using bedrails?: None Help needed moving from lying on your back to sitting on the side of a flat bed without using bedrails?: None Help needed moving to and from a bed to a chair (including a wheelchair)?: A Little Help needed standing up from a chair using your arms (e.g., wheelchair or bedside chair)?: A Little Help needed to walk in hospital room?: A Little Help needed climbing 3-5 steps with a railing? : A Little 6 Click Score: 20    End of Session Equipment Utilized During Treatment: Cervical collar Activity Tolerance: Patient tolerated treatment well;Patient limited by pain Patient left: in bed;with call bell/phone within reach;with family/visitor present Nurse Communication: Mobility status PT Visit Diagnosis: Other abnormalities of gait and mobility (R26.89);Difficulty in walking, not elsewhere classified (R26.2)    Time: 1440-1500 PT Time Calculation (min) (ACUTE ONLY): 20 min   Charges:   PT Evaluation $PT Eval Low Complexity: 1 Low        Melvine Julin E, PT Acute Rehabilitation Services Pager [phone]   Office [phone]   Tamar Lipscomb D Despina Hidden 11/02/2020, 3:14 PM

## 2020-11-02 NOTE — Brief Op Note (Signed)
11/02/2020  12:36 PM  PATIENT:  Nicholas Horne  49 y.o. male  PRE-OPERATIVE DIAGNOSIS:  2 level cervical radiculopathy  POST-OPERATIVE DIAGNOSIS:  2 level cervical radiculopathy  PROCEDURE:  Procedure(s) with comments: Total disc replacement C5-7 (N/A) - 3 hrs  SURGEON:  Surgeon(s) and Role:    Venita Lick, MD - Primary  PHYSICIAN ASSISTANT:  Marchelle Folks Ward, PA  ASSISTANTS:   ANESTHESIA:   general  EBL:  100 mL   BLOOD ADMINISTERED:none  DRAINS: none   LOCAL MEDICATIONS USED:  MARCAINE     SPECIMEN:  No Specimen  DISPOSITION OF SPECIMEN:  N/A  COUNTS:  YES  TOURNIQUET:  * No tourniquets in log *  DICTATION: .Dragon Dictation  PLAN OF CARE: Admit for overnight observation  PATIENT DISPOSITION:  PACU - hemodynamically stable.

## 2020-11-02 NOTE — H&P (Signed)
Addendum H&P  Patient presents today with ongoing significant neck and right radicular arm pain.  There has been no change in his clinical exam since his last office visit of 10/31/2020.  Surgical plan is to move forward with a two-level cervical disc arthroplasty C5-7.  I have gone over the potential risks benefits and alternatives as well as the surgical plan with the patient in great detail and all of his questions and concerns were addressed.  I did also emphasize that for technical reasons if we have issues with inserting a disc arthroplasty that we may bailout to a fusion.  The patient has expressed a understanding as well as willingness to move forward with surgery.

## 2020-11-02 NOTE — Op Note (Signed)
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  5 x 14 mm at C5-6.  6 x 14 at C6-7.  First Assistant: Glynis Smiles, PA  Indications: Nicholas Horne is a very pleasant 49 year old gentleman with progressive neck and radicular right arm pain.  Despite appropriate conservative management his quality of life is continued to deteriorate.  He has cervical radiculopathy due to a disc protrusion at C5-6 and C6-7 causing C6 and C7 nerve compression respectively.  After discussing treatment options as well as risks and benefits he elected to move forward with surgery.  Operative report: Patient was brought the operating room placed upon the operating room table.  After successful induction of general anesthesia and endotracheal intubation, teds and SCDs were applied.  The anterior cervical spine was prepped and draped in a standard fashion.  Timeout was taken to confirm patient procedure and all other important data.  Lateral fluoroscopy was used to identify the C6 vertebral body and I marked out the incision site on the skin surface.  I infiltrated the incision with quarter percent Marcaine with epinephrine and then made a transverse incision.  Sharp dissection was carried out down to and through the platysma.  Identified the sternocleidomastoid and began dissecting along the medial border performing a standard Smith-Robinson approach to the anterior cervical spine.  Identified isolated and transected the omohyoid to provide better visualization.  I continued sharply dissecting through the deep cervical and prevertebral fascia until I could visualize the anterior longitudinal ligament.  I then bluntly dissected and mobilized the trachea and esophagus to the right  identified and protected the carotid sheath with my finger on the left side.  Hand-held retractor was used to maintain the trachea and esophagus to the right as I began mobilizing the longus coli muscle.  This was mobilized using bipolar cautery from the superior aspect of C5 to the inferior aspect of C7.  Once I had this completed I then placed a needle into the C5-6 disc space and took an intraoperative fluoroscopy view to confirm I was at the appropriate level.  Both the 5 6 and 6 7 disc spaces were then marked with the Bovie and I continued on with the discectomy.  The Caspar self-retaining retractors were placed underneath the longus coli muscle and the endotracheal cuff was deflated.  I expanded the retractor to the appropriate width and then reinflated the endotracheal cuff.  An annulotomy was performed with a 15 blade scalpel and I began removing all of the disc material at the C6-C7 disc space level.  Using AP fluoroscopy identified the central region of the C6 and C7 vertebral bodies and placed my awl there in order to place my distraction pins.  I confirmed that my distraction pins were directly in the midline using the spinous processes my reference.  I then distracted the intervertebral space with a laminar spreader and maintain that with the pin distractor system.  I continued with my curette to remove the remainder of the disc material.  I then used my nerve hook to develop a plane underneath the annulus and posterior longitudinal ligament and I resected that with my 1 mm Kerrison rongeur.  There is a hard disc osteophyte along with some soft disc material in the right lateral corner which was removed.  I used my  fine nerve hook to develop a plane under the PLL and I proceeded with a 1 mm Kerrison going to the right which allowed me to remove the disc fragments.  At this point the thecal sac was directly visualized and was noted to be adequately decompressed.  I then used my Kerrison rongeur to  undercut the uncovertebral joints to aid in the foraminal decompression at this point with the decompression/discectomy complete I then trialed the intervertebral spacers.  I elected to use the size 6 x 14 mm implant.  This provided the best overall fit.  He did not overstuff the compartment but it did provide satisfactory restoration of disc space height.  The endplates were then prepped according to manufacture to accept the implant.  After the osteotome was used to create the gouges for the implant I obtained the implant and malleted to the appropriate depth.  I confirmed with lateral fluoroscopy that I was at the appropriate depth and that the implant was seated properly in both the AP and lateral planes.  At this point time I repositioned my retracting system to expose the C5-6 disc space and performed a similar procedure at this level.  Using the same technique I used at C6-7 annulotomy was performed and I remove the bulk of the disc material.  At this level there was a large anterior exostosis which I had to remove with a Leksell rongeur.  The resulting bleeding bone was sealed with bone wax to decrease the potential risk of heterotopic ossification.  I continued using my nerve curettes to resect the disc material until I was at the posterior aspect of the disc space.  I then placed my distraction pins and expanded the lamina spreader and set the distraction.  I continue to use my #1 Kerrison rongeur to resect the posterior osteophytes from the vertebral bodies of the 5 and 6 similar to what I had done at the other level.  I again dissected with my fine nerve hook through the posterior longitudinal ligament in order to create a plane between the PLL and the thecal sac.  I then used my 1 mm Kerrison rongeur to resect the PLL and adequately decompress the spine.  I again undercut the uncovertebral joint osteophyte to further decompress the nerve root.  At this point I trialed the intervertebral spacers and  elected to use the size 5 x 14.  This provided the best overall fit with restoration of disc space height.  The endplates were prepped according manufacture standards and then the implant was inserted.  Distraction pins were removed and all bony bleeding edges were sealed with bone wax.  I irrigated the wound copiously with normal saline and made sure that hemostasis using proper electrocautery as well as FloSeal.  After final irrigation I returned the trachea and esophagus to midline.  At this point final x-rays were taken which demonstrated satisfactory positioning of both implants in both the AP and lateral planes.  The platysma was then closed with interrupted 2-0 Vicryl suture, and the skin was closed with 3-0 Monocryl.  Steri-Strips and dry dressings were applied as well as a soft collar.  Patient was extubated transfer the PACU without incident.  The end of the case all needle sponge counts were correct.

## 2020-11-02 NOTE — Transfer of Care (Signed)
Immediate Anesthesia Transfer of Care Note  Patient: MARCELINO CAMPOS  Procedure(s) Performed: Total disc replacement C5-7 (N/A Spine Cervical)  Patient Location: PACU  Anesthesia Type:General  Level of Consciousness: awake and alert   Airway & Oxygen Therapy: Patient Spontanous Breathing and Patient connected to face mask oxygen  Post-op Assessment: Report given to RN and Post -op Vital signs reviewed and stable  Post vital signs: Reviewed and stable  Last Vitals:  Vitals Value Taken Time  BP 111/63 11/02/20 1229  Temp 37.2 C 11/02/20 1230  Pulse 82 11/02/20 1232  Resp 18 11/02/20 1232  SpO2 96 % 11/02/20 1232  Vitals shown include unvalidated device data.  Last Pain:  Vitals:   11/02/20 1230  PainSc: Asleep      Patients Stated Pain Goal: 3 (11/02/20 0654)  Complications: No complications documented.

## 2020-11-02 NOTE — Anesthesia Postprocedure Evaluation (Signed)
Anesthesia Post Note  Patient: Nicholas Horne  Procedure(s) Performed: Total disc replacement C5-7 (N/A Spine Cervical)     Patient location during evaluation: PACU Anesthesia Type: General Level of consciousness: awake and alert Pain management: pain level controlled Vital Signs Assessment: post-procedure vital signs reviewed and stable Respiratory status: spontaneous breathing, nonlabored ventilation, respiratory function stable and patient connected to nasal cannula oxygen Cardiovascular status: blood pressure returned to baseline and stable Postop Assessment: no apparent nausea or vomiting Anesthetic complications: no   No complications documented.  Last Vitals:  Vitals:   11/02/20 1300 11/02/20 1315  BP: 129/74 134/87  Pulse: 85 75  Resp: 12 14  Temp:    SpO2: 93% 96%    Last Pain:  Vitals:   11/02/20 1300  PainSc: 8                  Trevor Iha

## 2020-11-02 NOTE — Discharge Instructions (Signed)

## 2020-11-03 ENCOUNTER — Other Ambulatory Visit: Payer: Self-pay | Admitting: Internal Medicine

## 2020-11-03 DIAGNOSIS — M5412 Radiculopathy, cervical region: Secondary | ICD-10-CM | POA: Diagnosis not present

## 2020-11-03 DIAGNOSIS — A048 Other specified bacterial intestinal infections: Secondary | ICD-10-CM

## 2020-11-03 DIAGNOSIS — M50122 Cervical disc disorder at C5-C6 level with radiculopathy: Secondary | ICD-10-CM | POA: Diagnosis not present

## 2020-11-03 DIAGNOSIS — M50123 Cervical disc disorder at C6-C7 level with radiculopathy: Secondary | ICD-10-CM | POA: Diagnosis not present

## 2020-11-03 DIAGNOSIS — M4722 Other spondylosis with radiculopathy, cervical region: Secondary | ICD-10-CM | POA: Diagnosis not present

## 2020-11-03 NOTE — Progress Notes (Signed)
    Subjective: Procedure(s) (LRB): Total disc replacement C5-7 (N/A) 1 Day Post-Op  Patient reports pain as 1 on 0-10 scale.  Reports decreased arm pain reports incisional neck pain   Positive void Negative bowel movement Positive flatus Negative chest pain or shortness of breath  Objective: Vital signs in last 24 hours: Temp:  [97.5 F (36.4 C)-98.9 F (37.2 C)] 97.9 F (36.6 C) (12/10 0735) Pulse Rate:  [71-87] 81 (12/10 0735) Resp:  [12-20] 18 (12/10 0735) BP: (111-158)/(63-95) 158/95 (12/10 0735) SpO2:  [93 %-100 %] 96 % (12/10 0735)  Intake/Output from previous day: 12/09 0701 - 12/10 0700 In: 2260 [P.O.:460; I.V.:1600; IV Piggyback:200] Out: 575 [Urine:475; Blood:100]  Labs: No results for input(s): WBC, RBC, HCT, PLT in the last 72 hours. No results for input(s): NA, K, CL, CO2, BUN, CREATININE, GLUCOSE, CALCIUM in the last 72 hours. No results for input(s): LABPT, INR in the last 72 hours.  Physical Exam: Neurologically intact ABD soft Intact pulses distally Incision: dressing C/D/I and no drainage Compartment soft Body mass index is 28.78 kg/m.  Assessment/Plan: Patient stable  xrays n/a Mobilization with physical therapy Encourage incentive spirometry Continue care  Advance diet Up with therapy  Patient is doing exceptionally well.  Demonstrated some self-directed range of motion exercises to do over the next 2 weeks.  Patient was noted to have some nausea but this resolved with medications.  At this point time is doing exceptionally well.  No dysphagia or dysphonia.  Plan on discharge to home and follow-up with me in 2 weeks.  Venita Lick, MD [facility] [phone]

## 2020-11-03 NOTE — Evaluation (Signed)
Occupational Therapy Evaluation Patient Details Name: Nicholas Horne MRN: [id number] DOB: [birth year] Today's Date: 11/03/2020    History of Present Illness 49 yo male s/p C5-7 cervical disc replacement for cervical radiculopathy. PMH includes OSA, cocaine and tobacco use, bipolar I.   Clinical Impression   PTA patient was living in a private residence with his spouse and was independent with all ADLs/IADLs without AD. 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 Patient currently presents near baseline level of function requiring supervision A grossly for ADLs, ADL transfers, and short-distance functional mobility in hospital room without use of AD. Patient does not require continued acute occupational therapy services at this time with OT to sign off.     Follow Up Recommendations  No OT follow up;Supervision - Intermittent    Equipment Recommendations  3 in 1 bedside commode    Recommendations for Other Services       Precautions / Restrictions Precautions Precautions: Cervical Precaution Booklet Issued: No Precaution Comments: Patient able to recall cervical precautions with good carryover during functional activities. Required Braces or Orthoses: Cervical Brace Cervical Brace: Soft collar;Other (comment) Restrictions Weight Bearing Restrictions: No      Mobility Bed Mobility Overal bed mobility: Needs Assistance Bed Mobility: Rolling;Sidelying to Sit;Sit to Sidelying Rolling: Modified independent (Device/Increase time) Sidelying to sit: Supervision       General bed mobility comments: Increased time/effort. Good carryover of log rolling technique.    Transfers Overall transfer level: Needs assistance Equipment used: None Transfers: Sit to/from Stand Sit to Stand: Supervision         General transfer comment: For safety.    Balance Overall balance assessment: Mild  deficits observed, not formally tested                                         ADL either performed or assessed with clinical judgement   ADL Overall ADL's : Needs assistance/impaired Eating/Feeding: Independent   Grooming: Supervision/safety;Standing Grooming Details (indicate cue type and reason): For safety         Upper Body Dressing : Set up;Sitting   Lower Body Dressing: Min guard;Sitting/lateral leans Lower Body Dressing Details (indicate cue type and reason): For safety. Mind unsteadiness upon standing.             Functional mobility during ADLs: Supervision/safety;Min guard General ADL Comments: Min guard for initial short-distance mobility around room progressing to supervision.     Vision   Vision Assessment?: No apparent visual deficits     Perception     Praxis      Pertinent Vitals/Pain Pain Assessment: 0-10 Pain Score: 5  Pain Location: neck Pain Descriptors / Indicators: Sore;Discomfort;Grimacing Pain Intervention(s): Limited activity within patient's tolerance;Monitored during session;Premedicated before session;Repositioned     Hand Dominance Right   Extremity/Trunk Assessment Upper Extremity Assessment Upper Extremity Assessment: Overall WFL for tasks assessed   Lower Extremity Assessment Lower Extremity Assessment: Overall WFL for tasks assessed   Cervical / Trunk Assessment Cervical / Trunk Assessment: Other exceptions (s/p spinal sx)   Communication Communication Communication: No difficulties   Cognition Arousal/Alertness: Awake/alert Behavior During Therapy: WFL for tasks assessed/performed Overall Cognitive Status: Within Functional Limits for tasks assessed  General Comments  Soft cervical collar in place throughout evaluation.    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        OT Problem List: Pain      OT Treatment/Interventions:      OT Goals(Current goals can be found in the care plan section) Acute Rehab OT Goals Patient Stated Goal: To return to work  OT Frequency:     Barriers to D/C:            Co-evaluation              AM-PAC OT "6 Clicks" Daily Activity     Outcome Measure Help from another person eating meals?: None Help from another person taking care of personal grooming?: None Help from another person toileting, which includes using toliet, bedpan, or urinal?: None Help from another person bathing (including washing, rinsing, drying)?: A Little Help from another person to put on and taking off regular upper body clothing?: None Help from another person to put on and taking off regular lower body clothing?: A Little 6 Click Score: 22   End of Session Equipment Utilized During Treatment: Gait belt Nurse Communication: Mobility status  Activity Tolerance: Patient tolerated treatment well Patient left: in chair;with call bell/phone within reach;with family/visitor present  OT Visit Diagnosis: Pain Pain - part of body:  (Cervical neck)                Time: 2197-5883 OT Time Calculation (min): 18 min Charges:  OT General Charges $OT Visit: 1 Visit OT Evaluation $OT Eval Moderate Complexity: 1 Mod  Meriam Chojnowski H. OTR/L Supplemental OT, Department of rehab services [phone]  Nusayba Cadenas R H. 11/03/2020, 8:48 AM

## 2020-11-03 NOTE — Progress Notes (Signed)
Patient alert and oriented, mae's well, voiding adequate amount of urine, swallowing without difficulty, no c/o pain at time of discharge. Patient discharged home with family. Script and discharged instructions given to patient. Patient and family stated understanding of instructions given. Patient has an appointment with Dr. Brooks  

## 2020-11-03 NOTE — Progress Notes (Signed)
Physical Therapy Treatment Patient Details Name: Nicholas Horne MRN: [id number] DOB: 1971/10/06 Today's Date: 11/03/2020    History of Present Illness 49 yo male s/p C5-7 cervical disc replacement for cervical radiculopathy on 11/02/2020.    PT Comments    Pt progressing well with post-op mobility. He was able to demonstrate transfers and ambulation with modified independence and no AD. Pt was educated on precautions, brace application/wearing schedule, appropriate activity progression, and car transfer. Pt has met acute PT goals and we will sign off at this time. If needs change, please reconsult.       Follow Up Recommendations  No PT follow up;Supervision for mobility/OOB     Equipment Recommendations  None recommended by PT    Recommendations for Other Services       Precautions / Restrictions Precautions Precautions: Cervical Precaution Booklet Issued: Yes (comment) Precaution Comments: Patient able to recall cervical precautions with good carryover during functional activities. Required Braces or Orthoses: Cervical Brace Cervical Brace: Soft collar Restrictions Weight Bearing Restrictions: No    Mobility  Bed Mobility               General bed mobility comments: Pt was received sitting up in the recliner.  Transfers Overall transfer level: Modified independent Equipment used: None Transfers: Sit to/from Stand           General transfer comment: No assist required. No unsteadiness noted.  Ambulation/Gait Ambulation/Gait assistance: Modified independent (Device/Increase time) Gait Distance (Feet): 400 Feet Assistive device: None Gait Pattern/deviations: Step-through pattern;Decreased stride length Gait velocity: WFL Gait velocity interpretation: >2.62 ft/sec, indicative of community ambulatory General Gait Details: Good maintenance of precautions. No unsteadiness or LOB noted.   Stairs Stairs: Yes Stairs assistance: Modified independent  (Device/Increase time) Stair Management: No rails;Alternating pattern;Forwards Number of Stairs: 10 General stair comments: Completed without difficulty. No unsteadiness or LOB noted.   Wheelchair Mobility    Modified Rankin (Stroke Patients Only)       Balance Overall balance assessment: Mild deficits observed, not formally tested                                          Cognition Arousal/Alertness: Awake/alert Behavior During Therapy: WFL for tasks assessed/performed Overall Cognitive Status: Within Functional Limits for tasks assessed                                        Exercises      General Comments        Pertinent Vitals/Pain Pain Assessment: 0-10 Pain Score: 4  Pain Location: neck Pain Descriptors / Indicators: Sore;Discomfort;Grimacing Pain Intervention(s): Limited activity within patient's tolerance;Monitored during session;Repositioned    Home Living Family/patient expects to be discharged to:: Private residence Living Arrangements: Spouse/significant other;Children Available Help at Discharge: Family;Available 24 hours/day Type of Home: House Home Access: Stairs to enter   Home Layout: One level Home Equipment: None      Prior Function Level of Independence: Independent      Comments: works as a Development worker, community, 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   PT Goals (current goals can now be found in the care plan section) Acute Rehab PT Goals Patient Stated Goal: To return to work PT Goal Formulation: With patient Time For Goal Achievement: 11/16/20 Potential to Achieve Goals:  Good    Frequency    Min 5X/week      PT Plan      Co-evaluation              AM-PAC PT "6 Clicks" Mobility   Outcome Measure  Help needed turning from your back to your side while in a flat bed without using bedrails?: None Help needed moving from lying on your back to sitting on the side of a flat bed without  using bedrails?: None Help needed moving to and from a bed to a chair (including a wheelchair)?: None Help needed standing up from a chair using your arms (e.g., wheelchair or bedside chair)?: None Help needed to walk in hospital room?: None Help needed climbing 3-5 steps with a railing? : None 6 Click Score: 24    End of Session Equipment Utilized During Treatment: Cervical collar Activity Tolerance: Patient tolerated treatment well;Patient limited by pain Patient left: in bed;with call bell/phone within reach;with family/visitor present Nurse Communication: Mobility status PT Visit Diagnosis: Other abnormalities of gait and mobility (R26.89);Difficulty in walking, not elsewhere classified (R26.2)     Time: 5188-4166 PT Time Calculation (min) (ACUTE ONLY): 12 min  Charges:  $Gait Training: 8-22 mins                     Rolinda Roan, PT, DPT Acute Rehabilitation Services Pager: [phone] Office: [phone]    Thelma Comp 11/03/2020, 2:32 PM

## 2020-11-06 ENCOUNTER — Encounter (HOSPITAL_COMMUNITY): Payer: Self-pay | Admitting: Orthopedic Surgery

## 2020-11-06 ENCOUNTER — Telehealth: Payer: Self-pay

## 2020-11-06 NOTE — Telephone Encounter (Signed)
Transition Care Management Unsuccessful Follow-up Telephone Call  Date of discharge and from where:  11/03/2020, [facility]  Attempts:  1st Attempt  Reason for unsuccessful TCM follow-up call:  Unable to leave message, call placed to # [phone], voicemail not set up.  He has an appointment with Dr Earlene Plater - 11/08/2020

## 2020-11-06 NOTE — Discharge Summary (Signed)
**Note Nicholas-Identified via Obfuscation** Patient ID: Nicholas Horne MRN: [id number] DOB/AGE: Apr 29, 1971 49 y.o.  Admit date: 11/02/2020 Discharge date: 11/06/2020  Admission Diagnoses:  Active Problems:   Cervical disc herniation   Discharge Diagnoses:  Active Problems:   Cervical disc herniation  status post Procedure(s): Total disc replacement C5-7  Past Medical History:  Diagnosis Date  . Bipolar disorder (manic depression) (HCC)   . Hypoglycemia     Surgeries: Procedure(s): Total disc replacement C5-7 on 11/02/2020   Consultants:   Discharged Condition: Improved  Hospital Course: Nicholas Horne is an 49 y.o. male who was admitted 11/02/2020 for operative treatment of 2 level cervical radiculopathy. Patient failed conservative treatments (please see the history and physical for the specifics) and had severe unremitting pain that affects sleep, daily activities and work/hobbies. After pre-op clearance, the patient was taken to the operating room on 11/02/2020 and underwent  Procedure(s): Total disc replacement C5-7.    Patient was given perioperative antibiotics:  Anti-infectives (From admission, onward)   Start     Dose/Rate Route Frequency Ordered Stop   11/02/20 1600  ceFAZolin (ANCEF) IVPB 1 g/50 mL premix        1 g 100 mL/hr over 30 Minutes Intravenous Every 8 hours 11/02/20 1308 11/02/20 2358   11/02/20 0639  ceFAZolin (ANCEF) IVPB 2g/100 mL premix        2 g 200 mL/hr over 30 Minutes Intravenous 30 min pre-op 11/02/20 5621 11/02/20 0825       Patient was given sequential compression devices and early ambulation to prevent DVT.   Patient benefited maximally from hospital stay and there were no complications. At the time of discharge, the patient was urinating/moving their bowels without difficulty, tolerating a regular diet, pain is controlled with oral pain medications and they have been cleared by PT/OT.   Recent vital signs: No data found.   Recent laboratory studies: No results for  input(s): WBC, HGB, HCT, PLT, NA, K, CL, CO2, BUN, CREATININE, GLUCOSE, INR, CALCIUM in the last 72 hours.  Invalid input(s): PT, 2   Discharge Medications:   Allergies as of 11/03/2020      Reactions   Abilify [aripiprazole] Other (See Comments)   Suicidal thoughts   Gabapentin    Felt funny      Medication List    STOP taking these medications   pantoprazole 40 MG tablet Commonly known as: PROTONIX     TAKE these medications   albuterol 108 (90 Base) MCG/ACT inhaler Commonly known as: VENTOLIN HFA Inhale 2 puffs into the lungs every 6 (six) hours as needed for wheezing or shortness of breath (cough).   methocarbamol 500 MG tablet Commonly known as: Robaxin Take 1 tablet (500 mg total) by mouth every 8 (eight) hours as needed for up to 5 days for muscle spasms.   multivitamin with minerals Tabs tablet Take 3 tablets by mouth daily.   omeprazole 40 MG capsule Commonly known as: PRILOSEC Take 1 capsule (40 mg total) by mouth 2 (two) times daily.   ondansetron 4 MG tablet Commonly known as: Zofran Take 1 tablet (4 mg total) by mouth every 8 (eight) hours as needed for nausea or vomiting.   oxyCODONE-acetaminophen 10-325 MG tablet Commonly known as: Percocet Take 1 tablet by mouth every 6 (six) hours as needed for up to 5 days for pain.   TESTOSTERONE CYPIONATE IM Inject 250 mg into the muscle every Wednesday.   ZINC-MAGNESIUM ASPART-VIT B6 PO Take 3 tablets by mouth daily.  Diagnostic Studies: DG Chest 2 View  Result Date: 10/30/2020 CLINICAL DATA:  Preop evaluation. EXAM: CHEST - 2 VIEW COMPARISON:  07/06/2019 chest radiograph and prior. FINDINGS: The heart size and mediastinal contours are within normal limits. Both lungs are clear. No pneumothorax or pleural effusion. Multilevel spondylosis. IMPRESSION: No focal airspace disease. Electronically Signed   By: Stana Bunting M.D.   On: 10/30/2020 09:46   DG Cervical Spine 2-3 Views  Result Date:  11/02/2020 CLINICAL DATA:  Elective surgery. Additional history provided: C5-6/6-7 disc replacement. Provided fluoroscopy time 2 minutes, 5 seconds (38.20 mGy). EXAM: CERVICAL SPINE - 2-3 VIEW; DG C-ARM 1-60 MIN COMPARISON:  Radiographs of the cervical spine 07/08/2020. FINDINGS: PA and lateral view intraoperative fluoroscopic images of the cervical spine are submitted, 3 images total. The images demonstrate disc prostheses at the C5-C6 and C6-C7 levels. Partially visualized ET tube. IMPRESSION: Three intraoperative fluoroscopic images from C5-C6 and C6-C7 disc arthroplasty, as described. Electronically Signed   By: Jackey Loge DO   On: 11/02/2020 12:26   DG C-Arm 1-60 Min  Result Date: 11/02/2020 CLINICAL DATA:  Elective surgery. Additional history provided: C5-6/6-7 disc replacement. Provided fluoroscopy time 2 minutes, 5 seconds (38.20 mGy). EXAM: CERVICAL SPINE - 2-3 VIEW; DG C-ARM 1-60 MIN COMPARISON:  Radiographs of the cervical spine 07/08/2020. FINDINGS: PA and lateral view intraoperative fluoroscopic images of the cervical spine are submitted, 3 images total. The images demonstrate disc prostheses at the C5-C6 and C6-C7 levels. Partially visualized ET tube. IMPRESSION: Three intraoperative fluoroscopic images from C5-C6 and C6-C7 disc arthroplasty, as described. Electronically Signed   By: Jackey Loge DO   On: 11/02/2020 12:26   Home sleep test  Result Date: 10/24/2020 Nicholas Budge, MD     10/28/2020 10:59 AM Patient Name: Nicholas Horne Study Date: 10/24/2020 Gender: Male D.O.B: May 24, 1971 Age (years): 49 Referring Provider: De Hollingshead DO Height (inches): 70 Interpreting Physician: Nicholas Duhamel MD, ABSM Weight (lbs): 208 RPSGT: Nicholas Horne BMI: 30 MRN: [id number] Neck Size: 17.50 CLINICAL INFORMATION Sleep Study Type: HST Indication for sleep study: OSA Epworth Sleepiness Score: 11 SLEEP STUDY TECHNIQUE A multi-channel overnight portable sleep study was performed. The channels  recorded were: nasal airflow, thoracic respiratory movement, and oxygen saturation with a pulse oximetry. Snoring was also monitored. MEDICATIONS Patient self administered medications include: none reported. SLEEP ARCHITECTURE Patient was studied for 498.2 minutes. The sleep efficiency was 100.0 % and the patient was supine for 86.7%. The arousal index was 0.0 per hour. RESPIRATORY PARAMETERS The overall AHI was 80.0 per hour, with a central apnea index of 0.0 per hour. The oxygen nadir was 67% during sleep. CARDIAC DATA Mean heart rate during sleep was 67.7 bpm. IMPRESSIONS - Severe obstructive sleep apnea occurred during this study (AHI = 80.0/h). - No significant central sleep apnea occurred during this study (CAI = 0.0/h). - Oxygen desaturation was noted during this study (Min O2 = 67%). Mean O2sat 89%. - Patient snored 1.9%. DIAGNOSIS - Obstructive Sleep Apnea (G47.33) - Nocturnal Hypoxemia (G47.36) RECOMMENDATIONS - Suggest CPAP titration sleep study or autopap 5-20. Other options would be based on clinical judgment. - Be careful with alcohol, sedatives and other CNS depressants that may worsen sleep apnea and disrupt normal sleep architecture. - Sleep hygiene should be reviewed to assess factors that may improve sleep quality. - Weight management and regular exercise should be initiated or continued. [Electronically signed] 10/28/2020 10:56 AM Nicholas Duhamel MD, ABSM Diplomate, American Board of Sleep Medicine NPI: [id number]  Nicholas Horne Diplomate, Biomedical engineer of Sleep Medicine ELECTRONICALLY SIGNED ON:  10/28/2020, 10:52 AM [facility] PH: [phone]   FX: [phone] ACCREDITED BY THE AMERICAN ACADEMY OF SLEEP MEDICINE  SLEEP STUDY DOCUMENTS  Result Date: 10/30/2020 Ordered by an unspecified provider.   Discharge Instructions    Incentive spirometry RT   Complete by: As directed        Follow-up Information    Venita Lick, MD. Schedule an appointment as  soon as possible for a visit in 2 weeks.   Specialty: Orthopedic Surgery Why: If symptoms worsen, For suture removal, For wound re-check Contact information: [address] [place] [address] [phone]               Discharge Plan:  discharge to home  Disposition: Stable    Signed: Leonette Monarch Twana Wileman for [facility] PA-C [facility] [phone] 11/06/2020, 8:21 AM

## 2020-11-07 ENCOUNTER — Telehealth: Payer: Self-pay

## 2020-11-07 NOTE — Telephone Encounter (Signed)
Transition Care Management Follow-up Telephone Call  Date of discharge and from where: 11/02/2020, [facility]   How have you been since you were released from the hospital? He said he is weak, trying to deal with the pain on his own.  He explained that he can't take the codeine because he started vomiting profusely He has not notified the surgeon of the reaction to the medication  Any questions or concerns? Yes, noted above   Items Reviewed:  Did the pt receive and understand the discharge instructions provided? Yes   Medications obtained and verified? Yes , no questions about the med regime  Other? No   Any new allergies since your discharge? No   Do you have support at home? Yes   Home Care and Equipment/Supplies: Were home health services ordered? no If so, what is the name of the agency? n/a Has the agency set up a time to come to the patient's home? n/a Were any new equipment or medical supplies ordered?  No What is the name of the medical supply agency? n/a Were you able to get the supplies/equipment? n/a Do you have any questions related to the use of the equipment or supplies? No, n/a  Functional Questionnaire: (I = Independent and D = Dependent) ADLs: independent  Follow up appointments reviewed:   PCP Hospital f/u appt confirmed? Yes  - Dr Earlene Plater, virtual visit tomorrow - 11/08/2020  Specialist Hospital f/u appt confirmed? No  - he needs to call Dr Shon Baton for a 2 week follow up. Encouraged him to call today to schedule the appointment and inform the provider of the reaction to codeine.    Are transportation arrangements needed? No   If their condition worsens, is the pt aware to call PCP or go to the Emergency Dept.?yes  Was the patient provided with contact information for the PCP's office or ED?  He has the phone number for [facility]  Was to pt encouraged to call back with questions or concerns?yes

## 2020-11-08 ENCOUNTER — Telehealth: Payer: BLUE CROSS/BLUE SHIELD | Admitting: Internal Medicine

## 2020-11-08 ENCOUNTER — Other Ambulatory Visit: Payer: Self-pay

## 2020-11-08 ENCOUNTER — Telehealth (INDEPENDENT_AMBULATORY_CARE_PROVIDER_SITE_OTHER): Payer: Self-pay | Admitting: Internal Medicine

## 2020-11-08 DIAGNOSIS — M5412 Radiculopathy, cervical region: Secondary | ICD-10-CM

## 2020-11-08 DIAGNOSIS — G4733 Obstructive sleep apnea (adult) (pediatric): Secondary | ICD-10-CM

## 2020-11-08 NOTE — Progress Notes (Signed)
Virtual Visit via Telephone Note  I connected with Vevelyn Francois, on 11/08/2020 at 4:17 PM by telephone due to the COVID-19 pandemic and verified that I am speaking with the correct person using two identifiers.   Consent: I discussed the limitations, risks, security and privacy concerns of performing an evaluation and management service by telephone and the availability of in person appointments. I also discussed with the patient that there may be a patient responsible charge related to this service. The patient expressed understanding and agreed to proceed.   Location of Patient: Home   Location of Provider: Clinic    Persons participating in Telemedicine visit: Keveon R Akim Watkinson [facility] Dr. Earlene Plater      History of Present Illness: Patient has a visit to follow up on recent hospitalization from 12/9-12/10 for cervical disc herniation with total disc replacement C5-7. Reports feeling alright but gets drained with minimal physical activity. Stopped taking Hydrocodone on Saturday because kept making him throw up. Pain is fairly well controlled. Taking Tylenol. Has follow up on 12/20 with surgeon.    Past Medical History:  Diagnosis Date  . Bipolar disorder (manic depression) (HCC)   . Hypoglycemia    Allergies  Allergen Reactions  . Abilify [Aripiprazole] Other (See Comments)    Suicidal thoughts  . Gabapentin     Felt funny    Current Outpatient Medications on File Prior to Visit  Medication Sig Dispense Refill  . albuterol (VENTOLIN HFA) 108 (90 Base) MCG/ACT inhaler Inhale 2 puffs into the lungs every 6 (six) hours as needed for wheezing or shortness of breath (cough). 18 g 0  . Multiple Vitamin (MULTIVITAMIN WITH MINERALS) TABS tablet Take 3 tablets by mouth daily.    Marland Kitchen omeprazole (PRILOSEC) 40 MG capsule Take 1 capsule (40 mg total) by mouth 2 (two) times daily. 20 capsule 0  . ondansetron (ZOFRAN) 4 MG tablet Take 1 tablet (4 mg total) by mouth every 8  (eight) hours as needed for nausea or vomiting. 20 tablet 0  . TESTOSTERONE CYPIONATE IM Inject 250 mg into the muscle every Wednesday.    Marland Kitchen ZINC-MAGNESIUM ASPART-VIT B6 PO Take 3 tablets by mouth daily.     No current facility-administered medications on file prior to visit.    Observations/Objective: NAD. Speaking clearly.  Work of breathing normal.  Alert and oriented. Mood appropriate.   Assessment and Plan: 1. Cervical radiculopathy s/p disc replacement  Recovering well. Has appropriate follow up with orthopedic surgeon.   2. OSA (obstructive sleep apnea) New diagnosis. CPAP machine arrived today. Encouraged compliance and to call for any concerns.    Follow Up Instructions: PRN and for routine medical care    I discussed the assessment and treatment plan with the patient. The patient was provided an opportunity to ask questions and all were answered. The patient agreed with the plan and demonstrated an understanding of the instructions.   The patient was advised to call back or seek an in-person evaluation if the symptoms worsen or if the condition fails to improve as anticipated.     I provided 8 minutes total of non-face-to-face time during this encounter including median intraservice time, reviewing previous notes, investigations, ordering medications, medical decision making, coordinating care and patient verbalized understanding at the end of the visit.    Marcy Siren, D.O. Primary Care at [facility]  11/08/2020, 4:17 PM

## 2020-11-29 ENCOUNTER — Ambulatory Visit: Payer: BLUE CROSS/BLUE SHIELD | Admitting: Gastroenterology

## 2020-12-09 DIAGNOSIS — G4733 Obstructive sleep apnea (adult) (pediatric): Secondary | ICD-10-CM | POA: Diagnosis not present

## 2021-01-09 DIAGNOSIS — G4733 Obstructive sleep apnea (adult) (pediatric): Secondary | ICD-10-CM | POA: Diagnosis not present

## 2021-02-06 DIAGNOSIS — G4733 Obstructive sleep apnea (adult) (pediatric): Secondary | ICD-10-CM | POA: Diagnosis not present

## 2021-02-08 ENCOUNTER — Encounter (HOSPITAL_COMMUNITY): Payer: Self-pay | Admitting: Orthopedic Surgery

## 2021-03-09 DIAGNOSIS — G4733 Obstructive sleep apnea (adult) (pediatric): Secondary | ICD-10-CM | POA: Diagnosis not present

## 2021-04-08 DIAGNOSIS — G4733 Obstructive sleep apnea (adult) (pediatric): Secondary | ICD-10-CM | POA: Diagnosis not present

## 2021-05-19 ENCOUNTER — Emergency Department (HOSPITAL_COMMUNITY)
Admission: EM | Admit: 2021-05-19 | Discharge: 2021-05-19 | Disposition: A | Discharge status: Home / Self Care | Payer: BC Managed Care – PPO | Attending: Emergency Medicine | Admitting: Emergency Medicine

## 2021-05-19 ENCOUNTER — Other Ambulatory Visit: Payer: Self-pay

## 2021-05-19 ENCOUNTER — Encounter (HOSPITAL_COMMUNITY): Payer: Self-pay | Admitting: Emergency Medicine

## 2021-05-19 ENCOUNTER — Encounter (HOSPITAL_COMMUNITY)
Admission: EM | Disposition: A | Discharge status: Home / Self Care | Payer: Self-pay | Source: Home / Self Care | Attending: Emergency Medicine

## 2021-05-19 DIAGNOSIS — Z20822 Contact with and (suspected) exposure to covid-19: Secondary | ICD-10-CM | POA: Insufficient documentation

## 2021-05-19 DIAGNOSIS — S61412A Laceration without foreign body of left hand, initial encounter: Secondary | ICD-10-CM | POA: Insufficient documentation

## 2021-05-19 DIAGNOSIS — R55 Syncope and collapse: Secondary | ICD-10-CM | POA: Insufficient documentation

## 2021-05-19 DIAGNOSIS — Y9301 Activity, walking, marching and hiking: Secondary | ICD-10-CM | POA: Diagnosis not present

## 2021-05-19 DIAGNOSIS — Z23 Encounter for immunization: Secondary | ICD-10-CM | POA: Diagnosis not present

## 2021-05-19 DIAGNOSIS — J45909 Unspecified asthma, uncomplicated: Secondary | ICD-10-CM | POA: Insufficient documentation

## 2021-05-19 DIAGNOSIS — W268XXA Contact with other sharp object(s), not elsewhere classified, initial encounter: Secondary | ICD-10-CM | POA: Diagnosis not present

## 2021-05-19 DIAGNOSIS — Z87891 Personal history of nicotine dependence: Secondary | ICD-10-CM | POA: Insufficient documentation

## 2021-05-19 DIAGNOSIS — S6992XA Unspecified injury of left wrist, hand and finger(s), initial encounter: Secondary | ICD-10-CM | POA: Diagnosis not present

## 2021-05-19 HISTORY — PX: INCISION AND DRAINAGE OF WOUND: SHX1803

## 2021-05-19 LAB — CBC
HCT: 47.2 % (ref 39.0–52.0)
HCT: 39.3 % (ref 39.0–52.0)
Hemoglobin: 15.6 g/dL (ref 13.0–17.0)
Hemoglobin: 12.8 g/dL — ABNORMAL LOW (ref 13.0–17.0)
MCH: 30.1 pg (ref 26.0–34.0)
MCH: 30.3 pg (ref 26.0–34.0)
MCHC: 33.1 g/dL (ref 30.0–36.0)
MCHC: 32.6 g/dL (ref 30.0–36.0)
MCV: 91.1 fL (ref 80.0–100.0)
MCV: 93.1 fL (ref 80.0–100.0)
Platelets: 373 10*3/uL (ref 150–400)
Platelets: 269 10*3/uL (ref 150–400)
RBC: 5.18 MIL/uL (ref 4.22–5.81)
RBC: 4.22 MIL/uL (ref 4.22–5.81)
RDW: 14.6 % (ref 11.5–15.5)
RDW: 15 % (ref 11.5–15.5)
WBC: 19.2 10*3/uL — ABNORMAL HIGH (ref 4.0–10.5)
WBC: 17.6 10*3/uL — ABNORMAL HIGH (ref 4.0–10.5)
nRBC: 0 % (ref 0.0–0.2)
nRBC: 0 % (ref 0.0–0.2)

## 2021-05-19 LAB — BASIC METABOLIC PANEL
Anion gap: 10 (ref 5–15)
BUN: 17 mg/dL (ref 6–20)
CO2: 23 mmol/L (ref 22–32)
Calcium: 9.4 mg/dL (ref 8.9–10.3)
Chloride: 106 mmol/L (ref 98–111)
Creatinine, Ser: 1.45 mg/dL — ABNORMAL HIGH (ref 0.61–1.24)
GFR, Estimated: 59 mL/min — ABNORMAL LOW (ref >60)
Glucose, Bld: 118 mg/dL — ABNORMAL HIGH (ref 70–99)
Potassium: 4.5 mmol/L (ref 3.5–5.1)
Sodium: 139 mmol/L (ref 135–145)

## 2021-05-19 LAB — RESP PANEL BY RT-PCR (FLU A&B, COVID) ARPGX2
Influenza A by PCR: NEGATIVE
Influenza B by PCR: NEGATIVE
SARS Coronavirus 2 by RT PCR: NEGATIVE

## 2021-05-19 SURGERY — IRRIGATION AND DEBRIDEMENT WOUND
Anesthesia: LOCAL | Site: Hand | Laterality: Left

## 2021-05-19 MED ORDER — LIDOCAINE-EPINEPHRINE (PF) 2 %-1:200000 IJ SOLN
10.0000 mL | Freq: Once | INTRAMUSCULAR | Status: AC
  Administered 2021-05-19: 10 mL via INTRADERMAL
  Filled 2021-05-19: qty 20

## 2021-05-19 MED ORDER — 0.9 % SODIUM CHLORIDE (POUR BTL) OPTIME
TOPICAL | Status: DC | PRN
  Administered 2021-05-19: 1000 mL

## 2021-05-19 MED ORDER — CEPHALEXIN 500 MG PO CAPS: 500.0000 mg | ORAL_CAPSULE | Freq: Four times a day (QID) | ORAL | 0 refills | Status: DC

## 2021-05-19 MED ORDER — CEFAZOLIN SODIUM-DEXTROSE 1-4 GM/50ML-% IV SOLN
1.0000 g | Freq: Once | INTRAVENOUS | Status: AC
  Administered 2021-05-19: 01:00:00 1 g via INTRAVENOUS
  Filled 2021-05-19: qty 50

## 2021-05-19 MED ORDER — SODIUM CHLORIDE 0.9 % IV BOLUS
1000.0000 mL | Freq: Once | INTRAVENOUS | Status: AC
  Administered 2021-05-19: 1000 mL via INTRAVENOUS

## 2021-05-19 MED ORDER — BUPIVACAINE HCL (PF) 0.25 % IJ SOLN
20.0000 mL | Freq: Once | INTRAMUSCULAR | Status: DC
  Filled 2021-05-19: qty 30

## 2021-05-19 MED ORDER — TETANUS-DIPHTH-ACELL PERTUSSIS 5-2.5-18.5 LF-MCG/0.5 IM SUSY
0.5000 mL | PREFILLED_SYRINGE | Freq: Once | INTRAMUSCULAR | Status: AC
  Administered 2021-05-19: 01:00:00 0.5 mL via INTRAMUSCULAR
  Filled 2021-05-19: qty 0.5

## 2021-05-19 SURGICAL SUPPLY — 7 items
BNDG ELASTIC 3X5.8 VLCR STR LF (GAUZE/BANDAGES/DRESSINGS) ×1 IMPLANT
BNDG GAUZE ELAST 4 BULKY (GAUZE/BANDAGES/DRESSINGS) ×1 IMPLANT
DRSG XEROFORM 1X8 (GAUZE/BANDAGES/DRESSINGS) ×1 IMPLANT
ELECT REM PT RETURN 9FT ADLT (ELECTROSURGICAL) ×2
ELECTRODE REM PT RTRN 9FT ADLT (ELECTROSURGICAL) IMPLANT
GAUZE SPONGE 4X4 12PLY STRL (GAUZE/BANDAGES/DRESSINGS) ×1 IMPLANT
PENCIL BUTTON HOLSTER BLD 10FT (ELECTRODE) ×1 IMPLANT

## 2021-05-19 NOTE — ED Notes (Signed)
Pt called ride to come pick him up. Awaiting fluids to finish and pt will be discharged. Pt sitting up in chair at this time with call bell in reach.

## 2021-05-19 NOTE — Consult Note (Signed)
Reason for Consult:hand laceration - Left Referring Physician: ER  CC:I cut my hand  HPI:  Nicholas Horne is an 50 y.o. right handed male who presents with   laceration of L dorsal hand with active bleeding.  Pt states he was walking home and hit his hand on something, stated he felt something warm and wet and when looked down, noticed that he had cut it and it was bleeding.  Tried to stop it at home without success, presented to ER.  ER unable to stop bleeding, I was called.  When I arrived, pt had forearm tourniquet elevated. Pain is rated at  6  /10 and is described as sharp.  Pain is constant.  Pain is made better by rest/immobilization, worse with motion.   Associated signs/symptoms:denies other injuries; tendon injury Previous treatment:  n/a  Past Medical History:  Diagnosis Date   Bipolar disorder (manic depression) (HCC)    Cervical radiculopathy    GERD (gastroesophageal reflux disease)    Hypoglycemia    Mild intermittent reactive airway disease    OSA (obstructive sleep apnea)     Past Surgical History:  Procedure Laterality Date   CERVICAL DISC ARTHROPLASTY N/A 11/02/2020   Procedure: Total disc replacement C5-7;  Surgeon: Venita Lick, MD;  Location: [facility] OR;  Service: Orthopedics;  Laterality: N/A;  3 hrs   NASAL FRACTURE SURGERY  1993   TONSILLECTOMY      Family History  Problem Relation Age of Onset   Healthy Mother    Diabetes Father     Social History:  reports that he has quit smoking. He has never used smokeless tobacco. He reports previous drug use. Drug: Cocaine. He reports that he does not drink alcohol.  Allergies:  Allergies  Allergen Reactions   Abilify [Aripiprazole] Other (See Comments)    Suicidal thoughts   Gabapentin     Felt funny    Medications: I have reviewed the patient's current medications.  Results for orders placed or performed during the hospital encounter of 05/19/21 (from the past 48 hour(s))  CBC     Status: Abnormal    Collection Time: 05/19/21  1:31 AM  Result Value Ref Range   WBC 19.2 (H) 4.0 - 10.5 K/uL   RBC 5.18 4.22 - 5.81 MIL/uL   Hemoglobin 15.6 13.0 - 17.0 g/dL   HCT 40.0 86.7 - 61.9 %   MCV 91.1 80.0 - 100.0 fL   MCH 30.1 26.0 - 34.0 pg   MCHC 33.1 30.0 - 36.0 g/dL   RDW 50.9 32.6 - 71.2 %   Platelets 373 150 - 400 K/uL   nRBC 0.0 0.0 - 0.2 %    Comment: Performed at [facility], [address] [place], [place] [address]  Basic metabolic panel     Status: Abnormal   Collection Time: 05/19/21  1:31 AM  Result Value Ref Range   Sodium 139 135 - 145 mmol/L   Potassium 4.5 3.5 - 5.1 mmol/L   Chloride 106 98 - 111 mmol/L   CO2 23 22 - 32 mmol/L   Glucose, Bld 118 (H) 70 - 99 mg/dL    Comment: Glucose reference range applies only to samples taken after fasting for at least 8 hours.   BUN 17 6 - 20 mg/dL   Creatinine, Ser 9.83 (H) 0.61 - 1.24 mg/dL   Calcium 9.4 8.9 - 38.2 mg/dL   GFR, Estimated 59 (L) >60 mL/min    Comment: (NOTE) Calculated using the  CKD-EPI Creatinine Equation (2021)    Anion gap 10 5 - 15    Comment: Performed at [facility], [address] [place], [place] [address]    No results found.  Pertinent items are noted in HPI. Temp:  [99.2 F (37.3 C)] 99.2 F (37.3 C) (06/25 0008) Pulse Rate:  [85-111] 91 (06/25 0200) Resp:  [20-30] 29 (06/25 0200) BP: (116-151)/(79-105) 135/80 (06/25 0200) SpO2:  [96 %-100 %] 100 % (06/25 0200) General appearance: alert and cooperative Resp: clear to auscultation bilaterally Cardio: regular rate and rhythm GI: soft, non-tender; bowel sounds normal; no masses,  no organomegaly Extremities: R wnl;  Dorsal hand laceration L ~4cm, deep into joint with active bleeding ; extensor tendon to IF lacerated  Assessment: Dorsal hand laceration, ext tendon laceration , active bleeding Plan: Will explore, repair structures as needed. I have discussed this treatment plan in detail with patient, including the risks of the  recommended treatment or surgery, the benefits and the alternatives.  The patient understands that additional treatment may be necessary.  Nicholas Horne 05/19/2021, 2:10 AM

## 2021-05-19 NOTE — ED Notes (Signed)
Hand paged per Dr.Pollina

## 2021-05-19 NOTE — Discharge Instructions (Addendum)
Call your doctor or go to urgent care in 10 to 14 days to have the sutures removed.  If any redness, swelling or drainage occurs, return to the ER.

## 2021-05-19 NOTE — OR Nursing (Signed)
This procedure was done at the patients bedside in ED room 24. Time out performed left hand laceration. Area numbed with local.Site cleaned with betadine. Bleeding controlled with cautery. Sutures irrigated with saline dressing applies xeroform.4 x 4, Kerlix, and 3 inch ace wrap. Patient tolerated well without ant difficulty.

## 2021-05-19 NOTE — ED Notes (Signed)
Tourniquet loosened for trial while EDP suturing

## 2021-05-19 NOTE — ED Notes (Signed)
French Ana, wife, [phone] would like an update when available

## 2021-05-19 NOTE — ED Notes (Signed)
Dr. Izora Ribas and OR RN at bedside

## 2021-05-19 NOTE — ED Provider Notes (Addendum)
[facility]IOPERATIVE ARE[facility] Provider Note   CSN: [id number] Arrival date & time: 05/19/21  0002     History Chief Complaint  Patient presents with  . Extremity Laceration    Nicholas Horne is a 50 y.o. male.  Patient presents to the ER for evaluation of laceration to left hand.  Patient reports that he was walking and hit his left hand on a sharp object, not sure what it was.  He suffered a laceration to the back of the hand.  Patient reports that it was bleeding a fair amount at home.  He kept trying to apply pressure but it did not stop bleeding.  After about an hour he presented to the ER.  While going through the triage process, vomited and then had a syncopal episode.  No injury.      Past Medical History:  Diagnosis Date  . Bipolar disorder (manic depression) (HCC)   . Cervical radiculopathy   . GERD (gastroesophageal reflux disease)   . Hypoglycemia   . Mild intermittent reactive airway disease   . OSA (obstructive sleep apnea)     Patient Active Problem List   Diagnosis Date Noted  . 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 OSA (obstructive sleep apnea) 10/30/2020  . Tobacco use disorder 09/29/2018  . Cocaine use disorder, moderate, dependence (HCC) 09/28/2018  . Suicidal ideation 09/28/2018  . Bipolar I disorder, most recent episode depressed, severe without psychotic features (HCC) 09/28/2018    Past Surgical History:  Procedure Laterality Date  . CERVICAL DISC ARTHROPLASTY N/A 11/02/2020   Procedure: Total disc replacement C5-7;  Surgeon: Venita Lick, MD;  Location: [facility] OR;  Service: Orthopedics;  Laterality: N/A;  3 hrs  . NASAL FRACTURE SURGERY  1993  . TONSILLECTOMY         Family History  Problem Relation Age of Onset  . Healthy Mother   . Diabetes Father     Social History   Tobacco Use  . Smoking status: Former    Pack years: 0.00  . Smokeless tobacco: Never  Vaping Use  . Vaping Use: Never used  Substance Use Topics  . Alcohol  use: No  . Drug use: Not Currently    Types: Cocaine    Comment: Clean for the last 3 years.    Home Medications Prior to Admission medications   Medication Sig Start Date End Date Taking? Authorizing Provider  albuterol (VENTOLIN HFA) 108 (90 Base) MCG/ACT inhaler Inhale 2 puffs into the lungs every 6 (six) hours as needed for wheezing or shortness of breath (cough). 10/18/20  Yes Arvilla Market, MD  cephALEXin (KEFLEX) 500 MG capsule Take 1 capsule (500 mg total) by mouth 4 (four) times daily. 05/19/21  Yes Mickell Birdwell, Canary Brim, MD  Multiple Vitamin (MULTIVITAMIN WITH MINERALS) TABS tablet Take 3 tablets by mouth daily.   Yes [provider]  TESTOSTERONE CYPIONATE IM Inject 250 mg into the muscle every Wednesday.   Yes [provider]  omeprazole (PRILOSEC) 40 MG capsule Take 1 capsule (40 mg total) by mouth 2 (two) times daily. Patient not taking: Reported on 05/19/2021 10/18/20   Arvilla Market, MD  ondansetron (ZOFRAN) 4 MG tablet Take 1 tablet (4 mg total) by mouth every 8 (eight) hours as needed for nausea or vomiting. Patient not taking: Reported on 05/19/2021 11/02/20   Venita Lick, MD    Allergies    Abilify [aripiprazole] and Gabapentin  Review of Systems   Review of Systems  Skin:  Positive for wound.  All other systems reviewed and are negative.  Physical Exam Updated Vital Signs BP 116/74   Pulse 91   Temp 99.2 F (37.3 C) (Oral)   Resp 20   SpO2 98%   Physical Exam HENT:     Head: Normocephalic and atraumatic.  Eyes:     Pupils: Pupils are equal, round, and reactive to light.  Cardiovascular:     Rate and Rhythm: Normal rate and regular rhythm.  Pulmonary:     Effort: Pulmonary effort is normal.     Breath sounds: Normal breath sounds.  Musculoskeletal:     Left hand: Laceration present. Normal range of motion (Normal abduction, abduction, opposition.  Normal flexion extension of wrist and fingers). Normal  strength. Normal sensation. Normal capillary refill.     Comments: Linear laceration across proximal aspect of second and third metacarpal region  Neurological:     Mental Status: He is alert.    ED Results / Procedures / Treatments   Labs (all labs ordered are listed, but only abnormal results are displayed) Labs Reviewed  CBC - Abnormal; Notable for the following components:      Result Value   WBC 19.2 (*)    All other components within normal limits  BASIC METABOLIC PANEL - Abnormal; Notable for the following components:   Glucose, Bld 118 (*)    Creatinine, Ser 1.45 (*)    GFR, Estimated 59 (*)    All other components within normal limits  RESP PANEL BY RT-PCR (FLU A&B, COVID) ARPGX2    EKG None  Radiology No results found.  Procedures .Marland KitchenLaceration Repair  Date/Time: 05/19/2021 2:40 AM Performed by: Gilda Crease, MD Authorized by: Gilda Crease, MD   Consent:    Consent obtained:  Verbal   Consent given by:  Patient   Risks, benefits, and alternatives were discussed: yes     Risks discussed:  Infection, pain and poor cosmetic result Universal protocol:    Procedure explained and questions answered to patient or proxy's satisfaction: yes     Site/side marked: yes     Immediately prior to procedure, a time out was called: yes     Patient identity confirmed:  Verbally with patient Anesthesia:    Anesthesia method:  Local infiltration   Local anesthetic:  Lidocaine 2% WITH epi Laceration details:    Location:  Hand   Hand location:  L hand, dorsum   Length (cm):  5 Pre-procedure details:    Preparation:  Patient was prepped and draped in usual sterile fashion Exploration:    Hemostasis achieved with:  Tourniquet   Contaminated: no   Treatment:    Area cleansed with:  Povidone-iodine   Irrigation solution:  Sterile saline Comments:     Large amount of bleeding noted from the wound.  3 separate figure-of-eight sutures were placed in an  attempt to achieve hemostasis.  What appeared to be venous oozing did stop with the figure-of-eight sutures, however at this point it was noted that there was bright red pulsatile blood coming from deeper in the wound.  Procedure was terminated.  No skin closure.   Medications Ordered in ED Medications  bupivacaine (PF) (MARCAINE) 0.25 % injection 20 mL ( Infiltration MAR Unhold 05/19/21 0255)  lidocaine-EPINEPHrine (XYLOCAINE W/EPI) 2 %-1:200000 (PF) injection 10 mL (10 mLs Intradermal Given 05/19/21 0039)  Tdap (BOOSTRIX) injection 0.5 mL (0.5 mLs Intramuscular Given 05/19/21 0037)  ceFAZolin (ANCEF) IVPB 1 g/50 mL premix (0 g Intravenous  Stopped 05/19/21 0204)  sodium chloride 0.9 % bolus 1,000 mL (0 mLs Intravenous Stopped 05/19/21 0301)    ED Course  I have reviewed the triage vital signs and the nursing notes.  Pertinent labs & imaging results that were available during my care of the patient were reviewed by me and considered in my medical decision making (see chart for details).    MDM Rules/Calculators/A&P                          Patient presents with a laceration to the back of the left hand.  Upon exploration, it did appear that there was perhaps a tendon laceration as well as what appeared to be arterial bleeding coming from deep within the wound.  I was unable to achieve hemostasis and therefore Dr. Izora Ribas, hand surgery was consulted.  He has come to the ER and treated the patient.  Recommends discharge with antibiotics, analgesia and suture removal in 10 to 14 days.  Final Clinical Impression(s) / ED Diagnoses Final diagnoses:  Laceration of left hand without foreign body, initial encounter  Vasovagal syncope    Rx / DC Orders ED Discharge Orders          Ordered    cephALEXin (KEFLEX) 500 MG capsule  4 times daily        05/19/21 0308             Gilda Crease, MD 05/19/21 7253    Gilda Crease, MD 06/21/21 [phone]

## 2021-05-19 NOTE — ED Notes (Signed)
Tourniquet applied.

## 2021-05-19 NOTE — ED Notes (Signed)
Tourniquet applied once again d/t uncontrolled bright red bleeding.

## 2021-05-19 NOTE — ED Triage Notes (Addendum)
Pt w lac to top of L hand, +ETOH, bleeding noted. Pressure applied in triage, Melvenia Beam PA notified

## 2021-05-19 NOTE — ED Notes (Signed)
Patient ambulatory for discharge. No distress noted. Family providing transportation home. Bandage to left upper extremity is intact, clean, and dry. Patient verbalized understanding of discharge instructions. Patient concerns and questions addressed.

## 2021-05-19 NOTE — Brief Op Note (Signed)
05/19/2021  2:17 AM  PATIENT:  Nicholas Horne  50 y.o. male  PRE-OPERATIVE DIAGNOSIS:  laceration of left hand  POST-OPERATIVE DIAGNOSIS:  * No post-op diagnosis entered *  PROCEDURE:  Procedure(s): IRRIGATION AND DEBRIDEMENT WOUND (Left)  SURGEON:  Surgeon(s) and Role:    * Knute Neu, MD - Primary  PHYSICIAN ASSISTANT:   ASSISTANTS: OR staff   ANESTHESIA:   local  EBL:  minimal   BLOOD ADMINISTERED:none  DRAINS: none   LOCAL MEDICATIONS USED:  LIDOCAINE  and Amount: 10 ml  SPECIMEN:  No Specimen  DISPOSITION OF SPECIMEN:  N/A  COUNTS:  YES  TOURNIQUET:  * No tourniquets in log *  DICTATION: .Dragon Dictation  PLAN OF CARE:  discharge home  PATIENT DISPOSITION:   good   Delay start of Pharmacological VTE agent (>24hrs) due to surgical blood loss or risk of bleeding: no

## 2021-05-19 NOTE — ED Notes (Signed)
Tourniquet loosened by MD.

## 2021-05-20 NOTE — Op Note (Signed)
NAME: Nicholas Horne, THUNDER MEDICAL RECORD NO: [id number] ACCOUNT NO: [id number] DATE OF BIRTH: Jul 04, 1971 FACILITY: [facility] LOCATION: [facility] PHYSICIAN: Darrol Brandenburg C. Izora Ribas, MD  Operative Report   DATE OF PROCEDURE: 05/20/2021  PREOPERATIVE DIAGNOSIS:  Complex open wound to the left wrist.  POSTOPERATIVE DIAGNOSIS:  Complex open wound to the left wrist.  PROCEDURE:  Exploration of complex wound of the left wrist; repair of extensor tendon, left wrist; irrigation of wound and closure of the skin.  INDICATIONS:  The patient is a 50 year old gentleman who presented to the ER with a complex laceration to his left wrist that was bleeding.  The ER could not control the bleeding and therefore, I was called in.  On initial evaluation, the patient had a  forearm tourniquet that was elevated.  He had a gaping wound that measured approximately 5 cm over the dorsal right wrist with a tendon exposed and obvious laceration.  Risks, benefits and alternatives of exploration and repair were discussed with the  patient.  He agreed.  Consent was obtained.  DESCRIPTION OF PROCEDURE:  The patient's hand was cleansed with Betadine solution, sterilely draped.  Initially, the wound was irrigated with saline solution and then explored.  Indeed, the extensor tendon to the index finger was completely lacerated.   There did not appear to be any additional extensor tendon laceration.  The laceration was deep down actually into the carpal joint.  There was some oozing from the synovium around the carpal joint.  This was controlled with cautery.  Next, thorough  irrigation including deep irrigation of the joint was then performed.  Tourniquet was released.  There was no active bleeding.  The wound was closed in layers with 4-0 Vicryl and 4-0 Prolene.  The extensor tendon to the index finger was repaired as well.   Sterile dressing was applied.  Postoperative instructions were given to the patient.   NIK D: 05/20/2021 7:27:19 am T:  05/20/2021 9:57:00 am  JOB: [id number]/ [id number]

## 2021-05-21 ENCOUNTER — Encounter (HOSPITAL_COMMUNITY): Payer: Self-pay | Admitting: General Surgery

## 2021-09-14 ENCOUNTER — Other Ambulatory Visit: Payer: Self-pay | Admitting: *Deleted

## 2021-09-14 ENCOUNTER — Telehealth: Payer: Self-pay | Admitting: Internal Medicine

## 2021-09-14 DIAGNOSIS — J4520 Mild intermittent asthma, uncomplicated: Secondary | ICD-10-CM

## 2021-09-14 MED ORDER — ALBUTEROL SULFATE HFA 108 (90 BASE) MCG/ACT IN AERS: 2.0000 | INHALATION_SPRAY | Freq: Four times a day (QID) | RESPIRATORY_TRACT | 0 refills | Status: DC | PRN

## 2021-09-14 NOTE — Telephone Encounter (Signed)
albuterol (VENTOLIN HFA) 108 (90 Base) MCG/ACT inhaler [[id number]]   Pharmacy  [facility] [address] ([address] [place] - [address]  [address] [place] ([place]) [place] [address]  Phone:  [phone]  Fax:  [phone]

## 2022-04-02 IMAGING — CR DG CHEST 2V
2 series · 2 of 2 positions shown · non-contrast
Comparison: 07/06/2019 chest radiograph and prior.

CLINICAL DATA: Preop evaluation.

EXAM:
CHEST - 2 VIEW

[w chest pa]
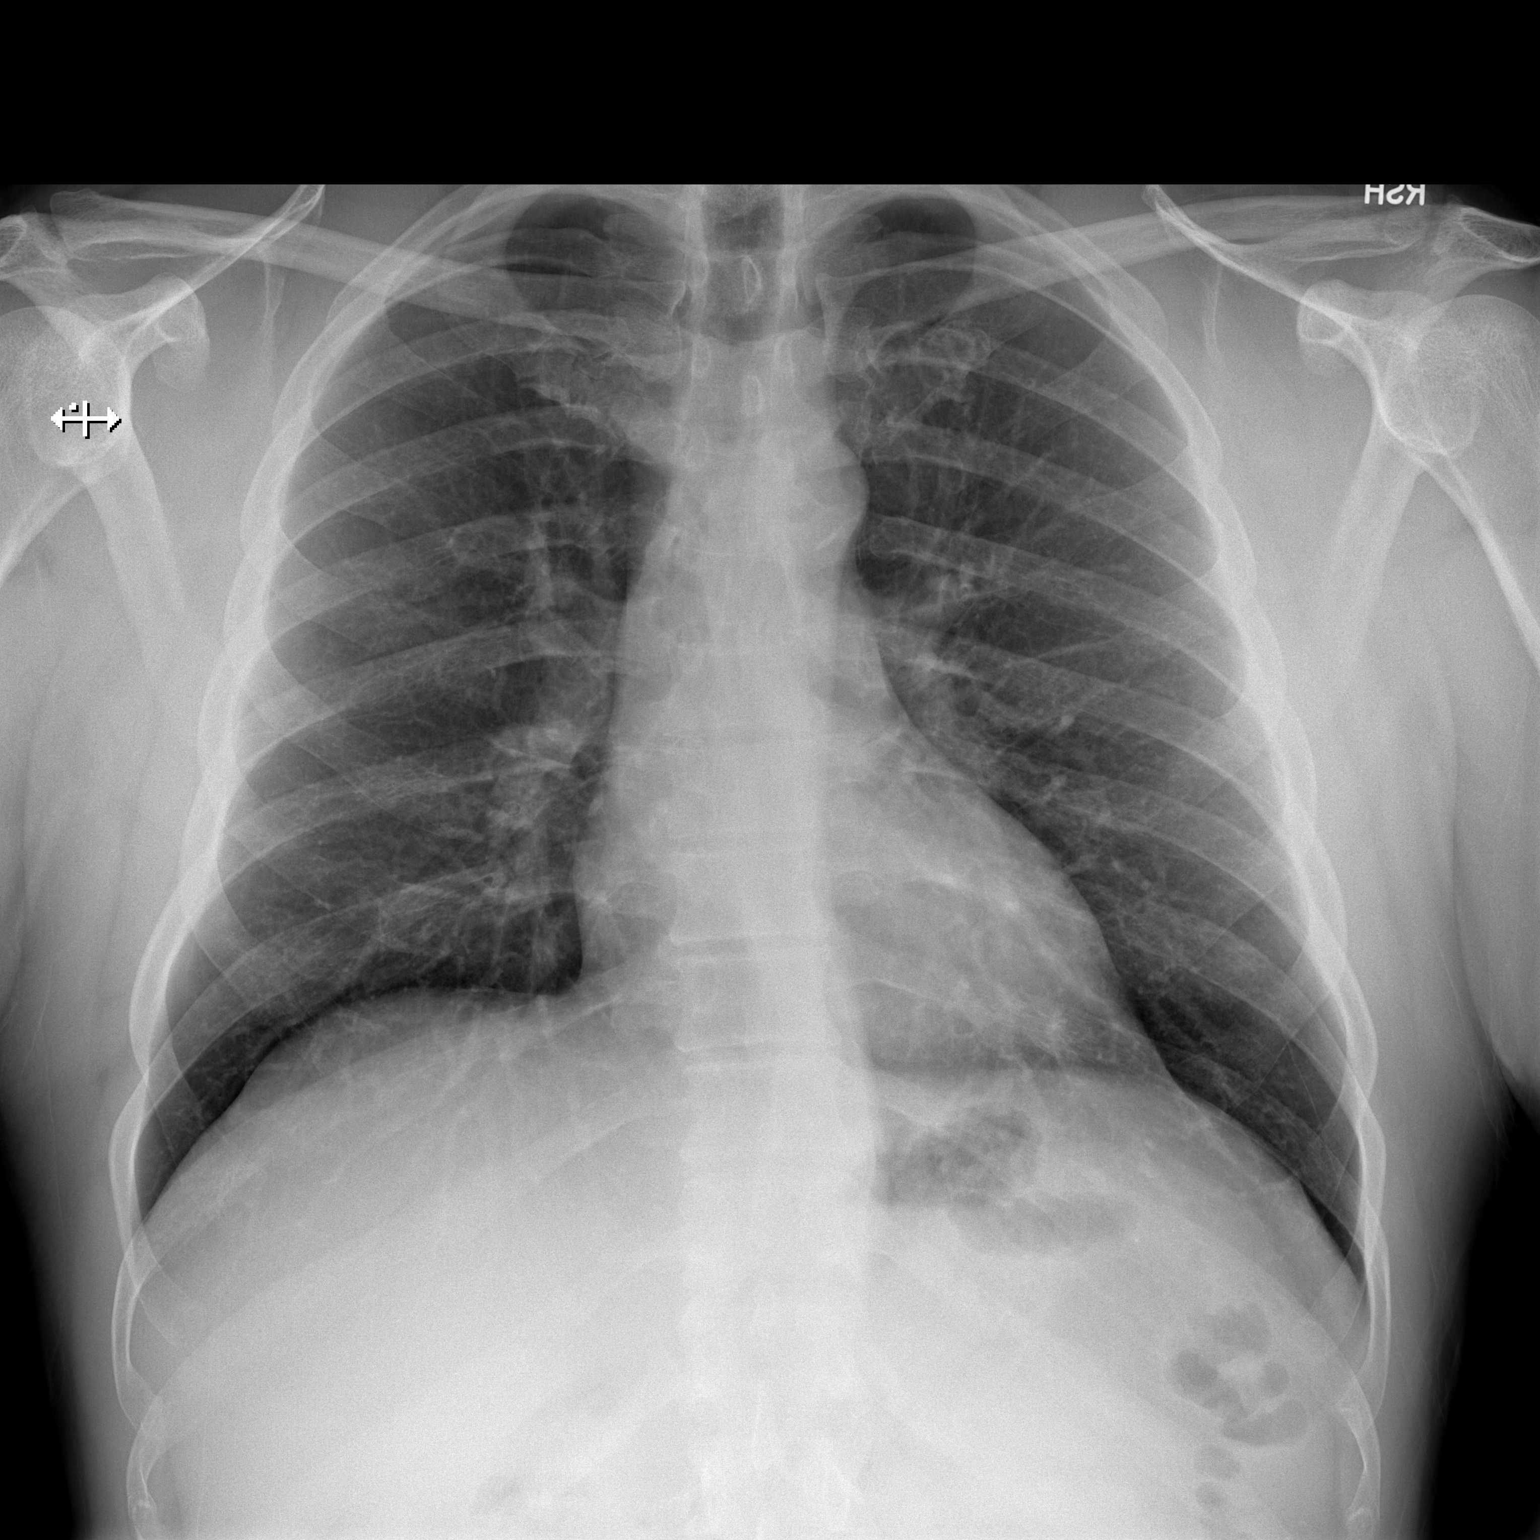

[w chest lat]
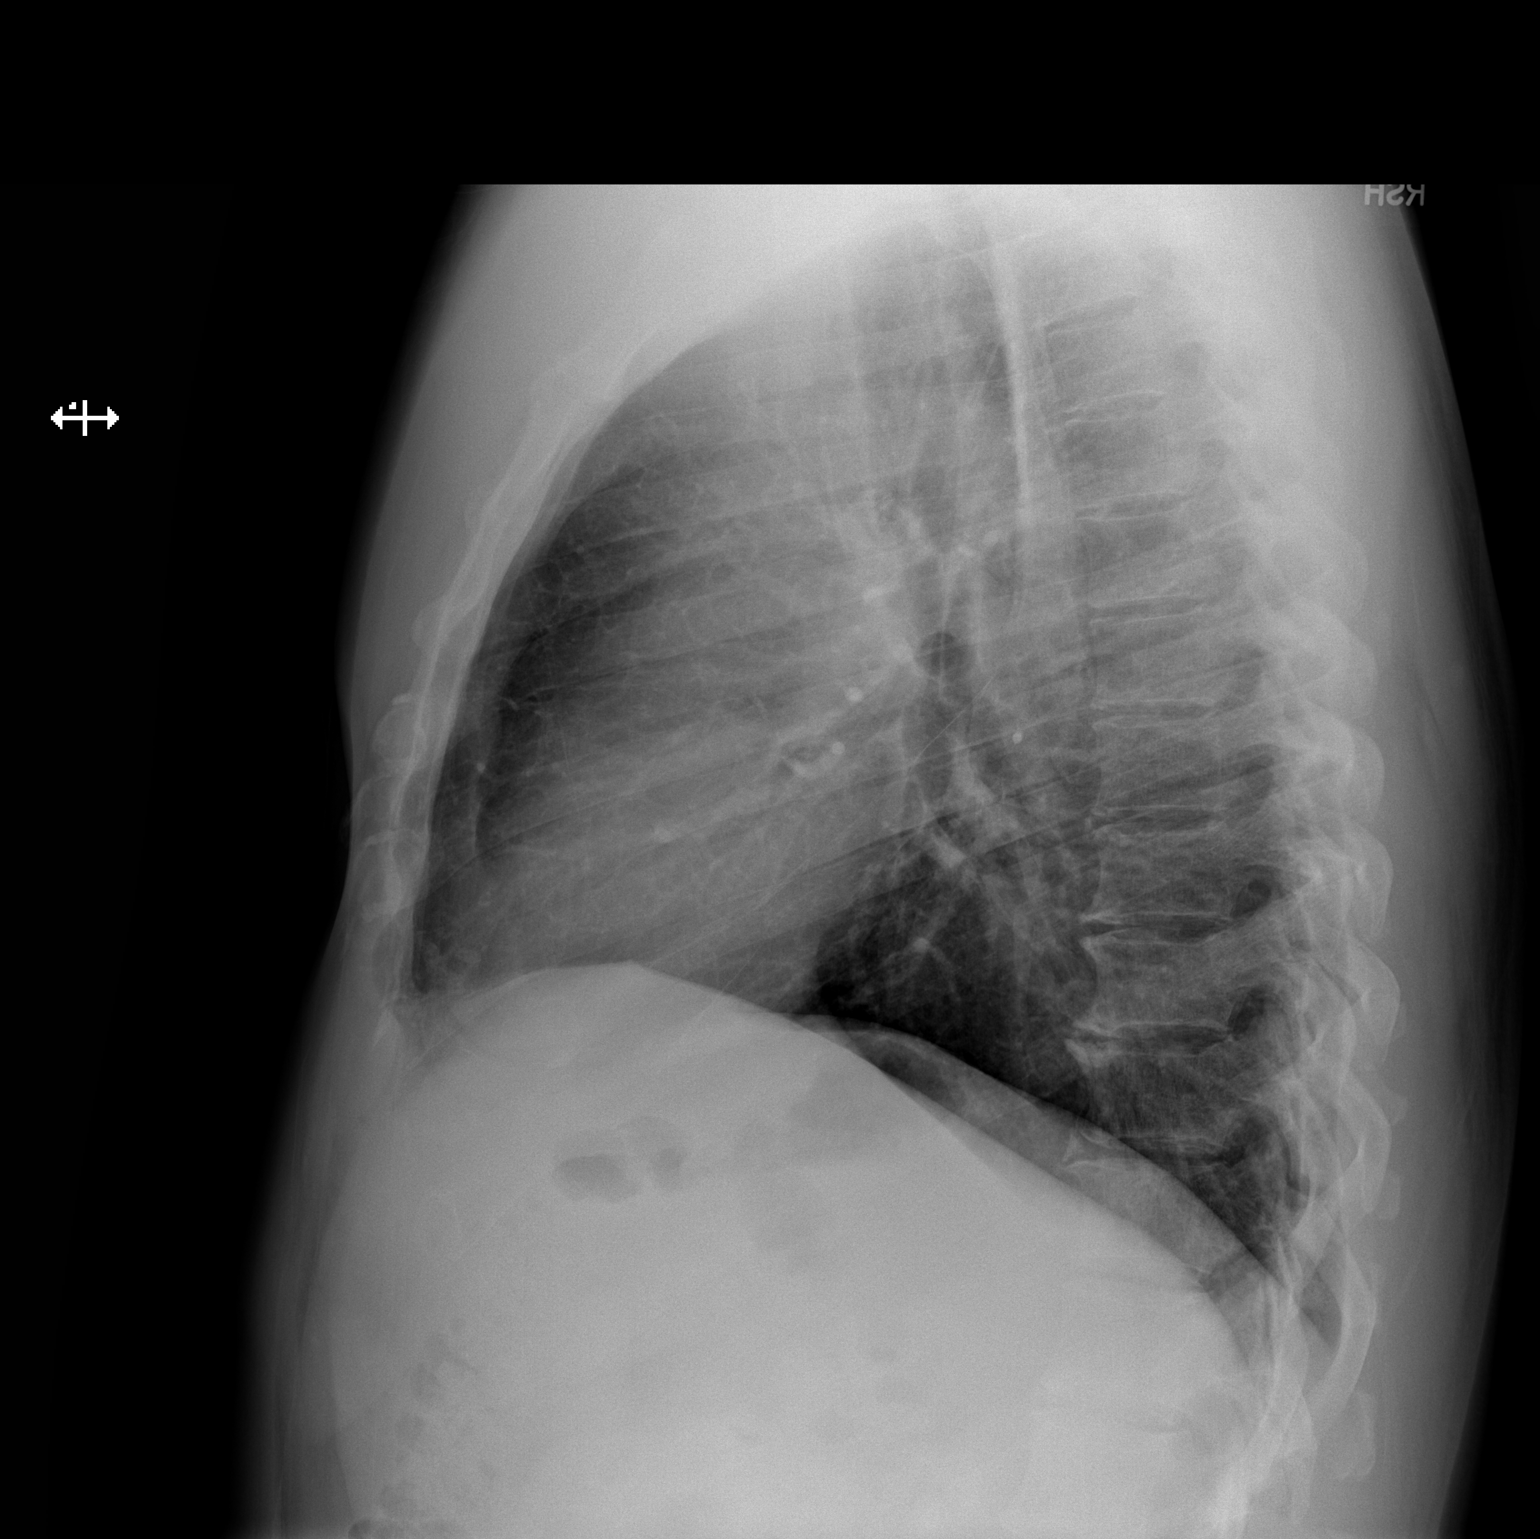

[2 of 2 positions shown; findings below may reference images not displayed]

FINDINGS: The heart size and mediastinal contours are within normal limits.
Both lungs are clear. No pneumothorax or pleural effusion.
Multilevel spondylosis.
IMPRESSION: No focal airspace disease.

## 2022-07-01 ENCOUNTER — Ambulatory Visit
Admission: EM | Admit: 2022-07-01 | Discharge: 2022-07-01 | Disposition: A | Discharge status: Home / Self Care | Payer: Self-pay | Attending: Internal Medicine | Admitting: Internal Medicine

## 2022-07-01 ENCOUNTER — Encounter: Payer: Self-pay | Admitting: *Deleted

## 2022-07-01 ENCOUNTER — Ambulatory Visit (INDEPENDENT_AMBULATORY_CARE_PROVIDER_SITE_OTHER): Payer: Self-pay

## 2022-07-01 DIAGNOSIS — W19XXXA Unspecified fall, initial encounter: Secondary | ICD-10-CM

## 2022-07-01 DIAGNOSIS — M79601 Pain in right arm: Secondary | ICD-10-CM

## 2022-07-01 DIAGNOSIS — M7989 Other specified soft tissue disorders: Secondary | ICD-10-CM

## 2022-07-01 DIAGNOSIS — S6991XA Unspecified injury of right wrist, hand and finger(s), initial encounter: Secondary | ICD-10-CM

## 2022-07-01 NOTE — ED Triage Notes (Signed)
Pt reports running and tripping 3 days ago, causing fall onto RUE. C/O pain in right elbow, forearm, wrist, and hand. Swelling noted to right hand. RUE CMS intact. Has been applying ice and wearing Ace wrap.

## 2022-07-01 NOTE — ED Provider Notes (Signed)
[name]    CSN: [id number] Arrival date & time: 07/01/22  1254      History   Chief Complaint Chief Complaint  Patient presents with   Arm Injury    HPI Nicholas Horne is a 51 y.o. male.   Patient presents with a right arm injury that occurred about 3 days ago.  Patient reports that he got off of his bike and was running up towards the house when he tripped over something in the landscape and fell landing on his arm.  Patient reports that he landed on his fist with the hand outstretched.  Having pain in the hand and wrist that extends into forearm.  Patient has applied ice, worn an Ace wrap, and taken ibuprofen with minimal improvement.  Denies any numbness or tingling.  Denies hitting head or losing consciousness during fall.   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   OSA (obstructive sleep apnea) 10/30/2020   Tobacco use disorder 09/29/2018   Cocaine use disorder, moderate, dependence (HCC) 09/28/2018   Suicidal ideation 09/28/2018   Bipolar I disorder, most recent episode depressed, severe without psychotic features (HCC) 09/28/2018    Past Surgical History:  Procedure Laterality Date   CERVICAL DISC ARTHROPLASTY N/A 11/02/2020   Procedure: Total disc replacement C5-7;  Surgeon: Venita Lick, MD;  Location: [facility] OR;  Service: Orthopedics;  Laterality: N/A;  3 hrs   INCISION AND DRAINAGE OF WOUND Left 05/19/2021   Procedure: IRRIGATION AND DEBRIDEMENT WOUND;  Surgeon: Knute Neu, MD;  Location: [facility] OR;  Service: Plastics;  Laterality: Left;   NASAL FRACTURE SURGERY  1993   TONSILLECTOMY         Home Medications    Prior to  Admission medications   Medication Sig Start Date End Date Taking? Authorizing Provider  TESTOSTERONE CYPIONATE IM Inject 250 mg into the muscle every Wednesday.   Yes [provider]  albuterol (VENTOLIN HFA) 108 (90 Base) MCG/ACT inhaler Inhale 2 puffs into the lungs every 6 (six) hours as needed for wheezing or shortness of breath (cough). 09/14/21   Georganna Skeans, MD  cephALEXin (KEFLEX) 500 MG capsule Take 1 capsule (500 mg total) by mouth 4 (four) times daily. 05/19/21   Gilda Crease, MD  Multiple Vitamin (MULTIVITAMIN WITH MINERALS) TABS tablet Take 3 tablets by mouth daily.    [provider]  omeprazole (PRILOSEC) 40 MG capsule Take 1 capsule (40 mg total) by mouth 2 (two) times daily. Patient not taking: Reported on 05/19/2021 10/18/20   Arvilla Market, MD  ondansetron (ZOFRAN) 4 MG tablet Take 1 tablet (4 mg total) by mouth every 8 (eight) hours as needed for nausea or vomiting. Patient not taking: Reported on 05/19/2021 11/02/20   Venita Lick, MD    Family History Family History  Problem Relation Age of Onset   Healthy Mother    Diabetes Father     Social History Social History   Tobacco Use   Smoking status: Some Days    Types: Cigarettes   Smokeless tobacco: Never  Vaping Use   Vaping Use: Never used  Substance Use Topics  Alcohol use: No   Drug use: Not Currently    Types: Cocaine    Comment: clean 5 yrs     Allergies   Abilify [aripiprazole] and Gabapentin   Review of Systems Review of Systems Per HPI  Physical Exam Triage Vital Signs ED Triage Vitals [07/01/22 1301]  Enc Vitals Group     BP 132/68     Pulse Rate 63     Resp 16     Temp (!) 97.5 F (36.4 C)     Temp Source Oral     SpO2 98 %     Weight      Height      Head Circumference      Peak Flow      Pain Score 8     Pain Loc      Pain Edu?      Excl. in [place]?    No data found.  Updated Vital Signs BP 132/68   Pulse 63   Temp (!) 97.5 F  (36.4 C) (Oral)   Resp 16   SpO2 98%   Visual Acuity Right Eye Distance:   Left Eye Distance:   Bilateral Distance:    Right Eye Near:   Left Eye Near:    Bilateral Near:     Physical Exam Constitutional:      General: He is not in acute distress.    Appearance: Normal appearance. He is not toxic-appearing or diaphoretic.  HENT:     Head: Normocephalic and atraumatic.  Eyes:     Extraocular Movements: Extraocular movements intact.     Conjunctiva/sclera: Conjunctivae normal.  Pulmonary:     Effort: Pulmonary effort is normal.  Musculoskeletal:     Comments: Tenderness to palpation to dorsal surface of hand with majority of pain being in lateral portion overlying fourth and fifth metatarsals.  Moderate swelling noted.  No obvious lacerations, abrasions, discoloration.  No tenderness to fingers.  Grip strength is minimal due to amount of swelling and limited range of motion.  Tenderness throughout entirety of wrist and is circumferential with swelling.  Tenderness to palpation that extends into forearm slightly.  No obvious swelling, discoloration, lacerations, abrasions noted.  No tenderness to antecubital space or posterior elbow.  Patient has full extension, supination, rotation of the elbow.  Neurovascular intact throughout.  Neurological:     General: No focal deficit present.     Mental Status: He is alert and oriented to person, place, and time. Mental status is at baseline.  Psychiatric:        Mood and Affect: Mood normal.        Behavior: Behavior normal.        Thought Content: Thought content normal.        Judgment: Judgment normal.      UC Treatments / Results  Labs (all labs ordered are listed, but only abnormal results are displayed) Labs Reviewed - No data to display  EKG   Radiology DG Hand Complete Right  Result Date: 07/01/2022 CLINICAL DATA:  Pt reports running and tripping 3 days ago, causing fall onto RUE. C/O pain in right elbow, forearm, wrist,  and hand. Swelling noted to right hand. EXAM: RIGHT WRIST - COMPLETE 3+ VIEW; RIGHT HAND - COMPLETE 3+ VIEW COMPARISON:  None Available. FINDINGS: There is no evidence of fracture or dislocation in the right hand or wrist. There is evidence of carpal bossing, likely incidental. There is no evidence of arthropathy or other focal bone abnormality.  Soft tissues are unremarkable. IMPRESSION: No acute osseous abnormality in the right hand or wrist. Electronically Signed   By: Emmaline Kluver M.D.   On: 07/01/2022 13:46   DG Wrist Complete Right  Result Date: 07/01/2022 CLINICAL DATA:  Pt reports running and tripping 3 days ago, causing fall onto RUE. C/O pain in right elbow, forearm, wrist, and hand. Swelling noted to right hand. EXAM: RIGHT WRIST - COMPLETE 3+ VIEW; RIGHT HAND - COMPLETE 3+ VIEW COMPARISON:  None Available. FINDINGS: There is no evidence of fracture or dislocation in the right hand or wrist. There is evidence of carpal bossing, likely incidental. There is no evidence of arthropathy or other focal bone abnormality. Soft tissues are unremarkable. IMPRESSION: No acute osseous abnormality in the right hand or wrist. Electronically Signed   By: Emmaline Kluver M.D.   On: 07/01/2022 13:46    Procedures Procedures (including critical care time)  Medications Ordered in UC Medications - No data to display  Initial Impression / Assessment and Plan / UC Course  I have reviewed the triage vital signs and the nursing notes.  Pertinent labs & imaging results that were available during my care of the patient were reviewed by me and considered in my medical decision making (see chart for details).     X-rays are negative for any acute bony abnormality.  Forearm and elbow are visualized on x-rays and no bony abnormality as well.  Suspect muscular strain/injury versus contusion given mechanism of injury.  Wrist brace applied in urgent care.  Discussed supportive care, ice application,  over-the-counter pain relievers.  Advised no pushing, pulling, lifting for at least 1 to 2 weeks.  Patient to follow-up with orthopedist at provided contact information if pain persists or worsens.  Patient verbalized understanding and was agreeable with plan. Final Clinical Impressions(s) / UC Diagnoses   Final diagnoses:  Fall, initial encounter  Pain and swelling of right upper extremity     Discharge Instructions      There are no breaks or dislocations on your x-ray.  Suspect that you have sprained your wrist/hand or caused some significant bruising.  Recommend that you elevate the hand, no lifting or pushing or pulling, and use ice application.  Follow-up if symptoms persist or worsen with provided contact information for orthopedist.     ED Prescriptions   None    PDMP not reviewed this encounter.   Gustavus Bryant, [place] 07/01/22 1410

## 2022-07-01 NOTE — Discharge Instructions (Signed)
There are no breaks or dislocations on your x-ray.  Suspect that you have sprained your wrist/hand or caused some significant bruising.  Recommend that you elevate the hand, no lifting or pushing or pulling, and use ice application.  Follow-up if symptoms persist or worsen with provided contact information for orthopedist.

## 2022-08-27 ENCOUNTER — Emergency Department (HOSPITAL_COMMUNITY)
Admission: EM | Admit: 2022-08-27 | Discharge: 2022-08-28 | Disposition: A | Discharge status: Home / Self Care | Payer: Commercial Managed Care - HMO | Attending: Emergency Medicine | Admitting: Emergency Medicine

## 2022-08-27 ENCOUNTER — Other Ambulatory Visit: Payer: Self-pay

## 2022-08-27 ENCOUNTER — Emergency Department (HOSPITAL_COMMUNITY): Payer: Commercial Managed Care - HMO

## 2022-08-27 ENCOUNTER — Encounter (HOSPITAL_COMMUNITY): Payer: Self-pay | Admitting: Emergency Medicine

## 2022-08-27 ENCOUNTER — Encounter: Payer: Self-pay | Admitting: Emergency Medicine

## 2022-08-27 ENCOUNTER — Ambulatory Visit
Admission: EM | Admit: 2022-08-27 | Discharge: 2022-08-27 | Disposition: A | Discharge status: Home / Self Care | Payer: Commercial Managed Care - HMO | Attending: Internal Medicine | Admitting: Internal Medicine

## 2022-08-27 DIAGNOSIS — R7309 Other abnormal glucose: Secondary | ICD-10-CM | POA: Diagnosis not present

## 2022-08-27 DIAGNOSIS — H538 Other visual disturbances: Secondary | ICD-10-CM | POA: Diagnosis not present

## 2022-08-27 DIAGNOSIS — F17200 Nicotine dependence, unspecified, uncomplicated: Secondary | ICD-10-CM | POA: Insufficient documentation

## 2022-08-27 DIAGNOSIS — R0789 Other chest pain: Secondary | ICD-10-CM | POA: Insufficient documentation

## 2022-08-27 DIAGNOSIS — R0602 Shortness of breath: Secondary | ICD-10-CM

## 2022-08-27 DIAGNOSIS — J45909 Unspecified asthma, uncomplicated: Secondary | ICD-10-CM | POA: Diagnosis not present

## 2022-08-27 DIAGNOSIS — R079 Chest pain, unspecified: Secondary | ICD-10-CM

## 2022-08-27 LAB — BASIC METABOLIC PANEL
Anion gap: 10 (ref 5–15)
BUN: 11 mg/dL (ref 6–20)
CO2: 25 mmol/L (ref 22–32)
Calcium: 9.7 mg/dL (ref 8.9–10.3)
Chloride: 103 mmol/L (ref 98–111)
Creatinine, Ser: 1.06 mg/dL (ref 0.61–1.24)
GFR, Estimated: >60 mL/min (ref >60)
Glucose, Bld: 66 mg/dL — ABNORMAL LOW (ref 70–99)
Potassium: 4.2 mmol/L (ref 3.5–5.1)
Sodium: 138 mmol/L (ref 135–145)

## 2022-08-27 LAB — CBG MONITORING, ED: Glucose-Capillary: 118 mg/dL — ABNORMAL HIGH (ref 70–99)

## 2022-08-27 LAB — CBC
HCT: 49 % (ref 39.0–52.0)
Hemoglobin: 16.1 g/dL (ref 13.0–17.0)
MCH: 31 pg (ref 26.0–34.0)
MCHC: 32.9 g/dL (ref 30.0–36.0)
MCV: 94.4 fL (ref 80.0–100.0)
Platelets: 225 10*3/uL (ref 150–400)
RBC: 5.19 MIL/uL (ref 4.22–5.81)
RDW: 13.4 % (ref 11.5–15.5)
WBC: 8.9 10*3/uL (ref 4.0–10.5)
nRBC: 0 % (ref 0.0–0.2)

## 2022-08-27 LAB — TROPONIN I (HIGH SENSITIVITY): Troponin I (High Sensitivity): 8 ng/L (ref <18)

## 2022-08-27 MED ORDER — ASPIRIN 81 MG PO CHEW
325.0000 mg | CHEWABLE_TABLET | Freq: Once | ORAL | Status: AC
  Administered 2022-08-27: 324 mg via ORAL

## 2022-08-27 NOTE — Discharge Instructions (Signed)
Patient sent to hospital via EMS.  

## 2022-08-27 NOTE — ED Notes (Signed)
EMS in department

## 2022-08-27 NOTE — ED Notes (Signed)
Patient alert and oriented x 3 mae x 4.  Patient appears restless: shifting around in seat, heavy sighs. Skin warm and dry Patient describes breathing as what he experienced prior to using CPAP.  Patient is not currently using CPAP.    Patient says last night around 7 p.m. started having chest discomfort described as tightness and heavy, blurry vision, nausea, feeling tired. Denies anything in particular going on at that time.   Patient says on Saturday had nausea and hot flashes.

## 2022-08-27 NOTE — ED Triage Notes (Signed)
Per [facility] pt coming from UC. C/o central chest pain with no radiation. EMS administered 324 aspirin and 3 nitro. States pain relieved from 8 to 5. Hx of cocaine use.

## 2022-08-27 NOTE — ED Provider Notes (Signed)
[name]    CSN: [id number] Arrival date & time: 08/27/22  1425      History   Chief Complaint Chief Complaint  Patient presents with   Shortness of Breath    Chest pressure    Blurred Vision    HPI Nicholas Horne is a 51 y.o. male.   Patient presents with shortness of breath, blurred vision, chest pressure, headache, dizziness that started around 7 PM last night.  Patient reports that it "feels like somebody is sitting on his chest".  Denies any obvious chest pain.  Patient also reports that he had an episode of nausea and vomiting about 4 days ago that is now resolved.  States that he wears glasses but is still having blurry vision that is different from baseline.  Headache is present in the bilateral sides of the head.  Reports that he has been told before that he has asthma and had to have a inhaler in the past.  He has not used an inhaler since the symptoms started.  Denies any associated upper respiratory symptoms, cough, fever.  Denies any known sick contacts.  Patient reports that he does not have any obvious cardiac history, although he was sent to the ER for chest pain in the past but no abnormalities were found per patient.  He denies any current illicit drug use but states that he has used cocaine in the past.   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   OSA (obstructive sleep apnea) 10/30/2020   Tobacco use disorder 09/29/2018   Cocaine use disorder, moderate, dependence (HCC) 09/28/2018   Suicidal ideation 09/28/2018   Bipolar I disorder, most recent episode depressed, severe without psychotic features (HCC) 09/28/2018    Past  Surgical History:  Procedure Laterality Date   CERVICAL DISC ARTHROPLASTY N/A 11/02/2020   Procedure: Total disc replacement C5-7;  Surgeon: Venita Lick, MD;  Location: [facility] OR;  Service: Orthopedics;  Laterality: N/A;  3 hrs   INCISION AND DRAINAGE OF WOUND Left 05/19/2021   Procedure: IRRIGATION AND DEBRIDEMENT WOUND;  Surgeon: Knute Neu, MD;  Location: [facility] OR;  Service: Plastics;  Laterality: Left;   NASAL FRACTURE SURGERY  1993   TONSILLECTOMY         Home Medications    Prior to Admission medications   Medication Sig Start Date End Date Taking? Authorizing Provider  albuterol (VENTOLIN HFA) 108 (90 Base) MCG/ACT inhaler Inhale 2 puffs into the lungs every 6 (six) hours as needed for wheezing or shortness of breath (cough). 09/14/21   Georganna Skeans, MD  cephALEXin (KEFLEX) 500 MG capsule Take 1 capsule (500 mg total) by mouth 4 (four) times daily. 05/19/21   Gilda Crease, MD  Multiple Vitamin (MULTIVITAMIN WITH MINERALS) TABS tablet Take 3 tablets by mouth daily.    [provider]  omeprazole (PRILOSEC) 40 MG capsule Take 1 capsule (40 mg total) by mouth 2 (two) times daily. Patient not taking: Reported on 05/19/2021 10/18/20   Arvilla Market, MD  ondansetron (ZOFRAN) 4 MG tablet Take 1 tablet (4 mg total) by mouth every 8 (eight) hours as needed for nausea  or vomiting. Patient not taking: Reported on 05/19/2021 11/02/20   Melina Schools, MD  TESTOSTERONE CYPIONATE IM Inject 250 mg into the muscle every Wednesday.    [provider]    Family History Family History  Problem Relation Age of Onset   Healthy Mother    Diabetes Father     Social History Social History   Tobacco Use   Smoking status: Some Days    Types: Cigarettes   Smokeless tobacco: Never  Vaping Use   Vaping Use: Never used  Substance Use Topics   Alcohol use: No   Drug use: Not Currently    Types: Cocaine    Comment: clean 5 yrs     Allergies   Abilify  [aripiprazole] and Gabapentin   Review of Systems Review of Systems Per HPI  Physical Exam Triage Vital Signs ED Triage Vitals  Enc Vitals Group     BP 08/27/22 1433 (!) 153/80     Pulse Rate 08/27/22 1433 76     Resp 08/27/22 1433 18     Temp 08/27/22 1433 98.1 F (36.7 C)     Temp src --      SpO2 08/27/22 1433 98 %     Weight --      Height --      Head Circumference --      Peak Flow --      Pain Score 08/27/22 1432 0     Pain Loc --      Pain Edu? --      Excl. in [place]? --    No data found.  Updated Vital Signs BP (!) 153/80   Pulse 76   Temp 98.1 F (36.7 C)   Resp 18   SpO2 98%   Visual Acuity Right Eye Distance:   Left Eye Distance:   Bilateral Distance:    Right Eye Near:   Left Eye Near:    Bilateral Near:     Physical Exam Constitutional:      General: He is not in acute distress.    Appearance: Normal appearance. He is not toxic-appearing or diaphoretic.     Comments: Appears very anxious and fidgety  HENT:     Head: Normocephalic and atraumatic.  Eyes:     Extraocular Movements: Extraocular movements intact.     Conjunctiva/sclera: Conjunctivae normal.     Pupils: Pupils are equal, round, and reactive to light.  Cardiovascular:     Rate and Rhythm: Normal rate and regular rhythm.     Pulses: Normal pulses.     Heart sounds: Normal heart sounds.  Pulmonary:     Effort: Pulmonary effort is normal. No respiratory distress.     Breath sounds: Normal breath sounds. No stridor. No wheezing, rhonchi or rales.  Neurological:     General: No focal deficit present.     Mental Status: He is alert and oriented to person, place, and time. Mental status is at baseline.     Cranial Nerves: Cranial nerves 2-12 are intact.     Sensory: Sensation is intact.     Motor: Motor function is intact.     Coordination: Coordination is intact.     Gait: Gait is intact.     Comments: Patient having difficulty following object in motion with EOM given blurred  vision.   Psychiatric:        Mood and Affect: Mood normal.        Behavior: Behavior normal.  Thought Content: Thought content normal.        Judgment: Judgment normal.      UC Treatments / Results  Labs (all labs ordered are listed, but only abnormal results are displayed) Labs Reviewed - No data to display  EKG   Radiology No results found.  Procedures Procedures (including critical care time)  Medications Ordered in UC Medications  aspirin chewable tablet 325 mg (324 mg Oral Given 08/27/22 1455)    Initial Impression / Assessment and Plan / UC Course  I have reviewed the triage vital signs and the nursing notes.  Pertinent labs & imaging results that were available during my care of the patient were reviewed by me and considered in my medical decision making (see chart for details).     EKG showing normal sinus rhythm but there is questionable minimal ST elevation in V2 and V3.  Patient also complaining of chest pressure, shortness of breath and blurred vision which is concerning.  Vital signs are stable with elevated blood pressure reading.  Given all of these factors, I do think patient warrants further evaluation and management at the hospital given limited resources here in urgent care.  325 chewable aspirin administered prior to discharge.  Patient was advised that it would be best to go to the hospital via EMS transport and was agreeable with plan.  Patient left via EMS transport. Final Clinical Impressions(s) / UC Diagnoses   Final diagnoses:  Other chest pain  Shortness of breath  Blurred vision     Discharge Instructions      Patient sent to hospital via EMS.      ED Prescriptions   None    PDMP not reviewed this encounter.   Gustavus Bryant, [place] 08/27/22 [phone]

## 2022-08-27 NOTE — ED Notes (Signed)
Patient oxygen tank replaced. Patient previously on oxygen; not when last set of vitals taken.

## 2022-08-27 NOTE — ED Triage Notes (Signed)
Pt is present today with c/o SOB, blurry vision, and chest pressure. Pt states that his sx started last night. Pt states that it is hard to take a deep breath without the feeling of someone sitting on his chest

## 2022-08-27 NOTE — ED Notes (Signed)
Patient is being discharged from the Urgent Care and sent to the Emergency Department via EMS . Per Oswaldo Conroy, NP, patient is in need of higher level of care due to chest pain. Patient is aware and verbalizes understanding of plan of care.  Vitals:   08/27/22 1433  BP: (!) 153/80  Pulse: 76  Resp: 18  Temp: 98.1 F (36.7 C)  SpO2: 98%

## 2022-08-27 NOTE — ED Provider Triage Note (Signed)
Emergency Medicine Provider Triage Evaluation Note  Nicholas Horne , a 51 y.o. male  was evaluated in triage.  Pt complains of chest pain x2 days.  Some central, no radiation.  Associate with nausea, seen in urgent care sent to ED for additional evaluation.  Given nitro and aspirin by EMS with improvement of symptoms.  Remote history of cocaine use..  Review of Systems  Per HPI  Physical Exam  BP 128/65 (BP Location: Right Arm)   Pulse 66   Temp 98.8 F (37.1 C) (Oral)   Resp 20   SpO2 99%  Gen:   Awake, no distress   Resp:  Normal effort  MSK:   Moves extremities without difficulty  Other:    Medical Decision Making  Medically screening exam initiated at 3:48 PM.  Appropriate orders placed.  Nicholas Horne was informed that the remainder of the evaluation will be completed by another provider, this initial triage assessment does not replace that evaluation, and the importance of remaining in the ED until their evaluation is complete.     Sherrill Raring, PA-C 08/27/22 1549

## 2022-08-28 LAB — TROPONIN I (HIGH SENSITIVITY): Troponin I (High Sensitivity): 5 ng/L (ref <18)

## 2022-08-28 NOTE — ED Provider Notes (Signed)
[facility] EMERGENCY DEPARTMENT Provider Note   CSN: [id number] Arrival date & time: 08/27/22  1543     History  Chief Complaint  Patient presents with   Chest Pain    Nicholas Horne is a 51 y.o. male.  The history is provided by the patient, medical records and the spouse.  Chest Pain  51 year old male with history of bipolar disorder, sleep apnea, GERD, presenting to the ED with chest pain.  Patient reports this began over the weekend and has persisted.  States initial symptoms presented Saturday night where he felt like he just could not catch his breath, felt sweaty, nauseated, but never threw up.  States he thought it was related to asthma.  Symptoms improved somewhat Sunday but worsened again yesterday.  Went to UC thinking may be he just needed an inhaler but sent here for further evaluation.  He did receive aspirin and nitroglycerin at urgent care and pain improved from 8/10 to 5/10 but still feels central chest pressure and feels like breathing is not at baseline.  Pain is worse with lying flat, better sitting up.  SOB does not change with position.  He denies cough, fever, or other URI symptoms.  No sick contacts.  He smokes occasionally when he drinks but not often.  Remote hx of cocaine use, none in the past few years.  No hx of DVT or PE.  Denies known hx of CAD.  No prior stress testing or cardiac cath.   Home Medications Prior to Admission medications   Medication Sig Start Date End Date Taking? Authorizing Provider  albuterol (VENTOLIN HFA) 108 (90 Base) MCG/ACT inhaler Inhale 2 puffs into the lungs every 6 (six) hours as needed for wheezing or shortness of breath (cough). 09/14/21  Yes Dorna Mai, MD  Multiple Vitamin (MULTIVITAMIN WITH MINERALS) TABS tablet Take 3 tablets by mouth daily.   Yes [provider]  TESTOSTERONE CYPIONATE IM Inject 250 mg into the muscle once a week.   Yes [provider]  omeprazole (PRILOSEC) 40 MG  capsule Take 1 capsule (40 mg total) by mouth 2 (two) times daily. Patient not taking: Reported on 05/19/2021 10/18/20   Nicolette Bang, MD  ondansetron (ZOFRAN) 4 MG tablet Take 1 tablet (4 mg total) by mouth every 8 (eight) hours as needed for nausea or vomiting. Patient not taking: Reported on 05/19/2021 11/02/20   Melina Schools, MD      Allergies    Abilify [aripiprazole] and Gabapentin    Review of Systems   Review of Systems  Cardiovascular:  Positive for chest pain.  All other systems reviewed and are negative.   Physical Exam Updated Vital Signs BP 126/78   Pulse 62   Temp 98.1 F (36.7 C) (Oral)   Resp (!) 24   Ht 5\' 11"  (1.803 m)   Wt 93.4 kg   SpO2 97%   BMI 28.73 kg/m   Physical Exam Vitals and nursing note reviewed.  Constitutional:      Appearance: He is well-developed.  HENT:     Head: Normocephalic and atraumatic.  Eyes:     Conjunctiva/sclera: Conjunctivae normal.     Pupils: Pupils are equal, round, and reactive to light.  Cardiovascular:     Rate and Rhythm: Normal rate and regular rhythm.     Heart sounds: Normal heart sounds.  Pulmonary:     Effort: Pulmonary effort is normal.     Breath sounds: Normal breath sounds.  Abdominal:  General: Bowel sounds are normal.     Palpations: Abdomen is soft.  Musculoskeletal:        General: Normal range of motion.     Cervical back: Normal range of motion.  Skin:    General: Skin is warm and dry.  Neurological:     Mental Status: He is alert and oriented to person, place, and time.     ED Results / Procedures / Treatments   Labs (all labs ordered are listed, but only abnormal results are displayed) Labs Reviewed  BASIC METABOLIC PANEL - Abnormal; Notable for the following components:      Result Value   Glucose, Bld 66 (*)    All other components within normal limits  CBG MONITORING, ED - Abnormal; Notable for the following components:   Glucose-Capillary 118 (*)    All other  components within normal limits  CBC  TROPONIN I (HIGH SENSITIVITY)  TROPONIN I (HIGH SENSITIVITY)    EKG EKG Interpretation  Date/Time:  Tuesday August 27 2022 15:40:34 EDT Ventricular Rate:  64 PR Interval:  168 QRS Duration: 94 QT Interval:  366 QTC Calculation: 377 R Axis:   11 Text Interpretation: Normal sinus rhythm Normal ECG When compared with ECG of 27-Aug-2022 14:45, PREVIOUS ECG IS PRESENT when compared to prior, similar appearance. \ No STEMI Confirmed by Antony Blackbird [phone]) on 08/27/2022 7:14:57 PM  Radiology DG Chest 2 View  Result Date: 08/27/2022 CLINICAL DATA:  Chest pain.  Chest pressure. EXAM: CHEST - 2 VIEW COMPARISON:  10/30/2020 FINDINGS: Heart size is normal. Chronic aortic atherosclerosis. The lungs are clear. The vascularity is normal. No effusions. Mild scoliotic curvature of the spine. Previous cervical surgery. IMPRESSION: No active cardiopulmonary disease. Chronic aortic atherosclerosis. Electronically Signed   By: Nelson Chimes M.D.   On: 08/27/2022 16:39    Procedures Procedures    Medications Ordered in ED Medications - No data to display  ED Course/ Medical Decision Making/ A&P                           Medical Decision Making  51 y.o. M presenting to the ED with chest pain, intermittent since Saturday.  Worse with lying flat, better sitting up.  Denies associated symptoms.  EKG NSR without acute ischemic changes.  Labs overall reassuring, initial trop negative.  CXR clear-- does have some aortic atherosclerosis.  Patient continues to report some mild ongoing chest pressure.  VS remain stable.  Will obtain delta trop.  2:33 AM Delta trop remains negative.  VSS.  He is not tachycardic nor hypoxic, no significant risk factors for PE.  Low suspicion for this.  Given negative work-up, low suspicion for ACS.  Given he has never really had issues like this before and does have family history with atherosclerosis seen on chest x-ray, will refer to  cardiology, may benefit from stress test.  Ambulatory referral sent, encouraged to get appt scheduled ASAP.  Can return here for any new/acute changes.  Final Clinical Impression(s) / ED Diagnoses Final diagnoses:  Chest pain in adult    Rx / DC Orders ED Discharge Orders          Ordered    Ambulatory referral to Cardiology        08/28/22 0234              Larene Pickett, PA-C 08/28/22 2694    Merryl Hacker, MD 08/28/22 [phone]

## 2022-08-28 NOTE — Discharge Instructions (Signed)
I have sent referral to cardiology office.  Make sure to get this scheduled as soon as possible. Return here for any new/acute changes-- recurrent pain, passing out, etc.

## 2022-08-28 NOTE — ED Notes (Signed)
Patient verbalizes understanding of discharge instructions. Opportunity for questioning and answers were provided. Armband removed by staff, pt discharged from ED. Pt ambulatory to ED waiting room with steady gait.  

## 2022-09-14 NOTE — Progress Notes (Deleted)
Cardiology Office Note   Date:  09/14/2022   ID:  DEMYAN FUGATE, DOB 06-28-1971, MRN [id number]  PCP:  Pcp, No  Cardiologist:   None Referring:  ***  No chief complaint on file.     History of Present Illness: Nicholas Horne is a 51 y.o. male who presents for evaluation of precordial chest pain.  He was in the ED for this earlier this month.  I reviewed these records for this visit.     ***     Past Medical History:  Diagnosis Date   Bipolar disorder (manic depression) (HCC)    Cervical radiculopathy    GERD (gastroesophageal reflux disease)    Hypoglycemia    Mild intermittent reactive airway disease    OSA (obstructive sleep apnea)     Past Surgical History:  Procedure Laterality Date   CERVICAL DISC ARTHROPLASTY N/A 11/02/2020   Procedure: Total disc replacement C5-7;  Surgeon: Melina Schools, MD;  Location: [facility];  Service: Orthopedics;  Laterality: N/A;  3 hrs   INCISION AND DRAINAGE OF WOUND Left 05/19/2021   Procedure: IRRIGATION AND DEBRIDEMENT WOUND;  Surgeon: Dayna Barker, MD;  Location: [facility];  Service: Plastics;  Laterality: Left;   NASAL FRACTURE SURGERY  1993   TONSILLECTOMY       Current Outpatient Medications  Medication Sig Dispense Refill   albuterol (VENTOLIN HFA) 108 (90 Base) MCG/ACT inhaler Inhale 2 puffs into the lungs every 6 (six) hours as needed for wheezing or shortness of breath (cough). 18 g 0   Multiple Vitamin (MULTIVITAMIN WITH MINERALS) TABS tablet Take 3 tablets by mouth daily.     omeprazole (PRILOSEC) 40 MG capsule Take 1 capsule (40 mg total) by mouth 2 (two) times daily. (Patient not taking: Reported on 05/19/2021) 20 capsule 0   ondansetron (ZOFRAN) 4 MG tablet Take 1 tablet (4 mg total) by mouth every 8 (eight) hours as needed for nausea or vomiting. (Patient not taking: Reported on 05/19/2021) 20 tablet 0   TESTOSTERONE CYPIONATE IM Inject 250 mg into the muscle once a week.     No current facility-administered medications  for this visit.    Allergies:   Abilify [aripiprazole] and Gabapentin    Social History:  The patient  reports that he has been smoking cigarettes. He has never used smokeless tobacco. He reports that he does not currently use drugs after having used the following drugs: Cocaine. He reports that he does not drink alcohol.   Family History:  The patient's ***family history includes Diabetes in his father; Healthy in his mother.    ROS:  Please see the history of present illness.   Otherwise, review of systems are positive for {NONE DEFAULTED:18576}.   All other systems are reviewed and negative.    PHYSICAL EXAM: VS:  There were no vitals taken for this visit. , BMI There is no height or weight on file to calculate BMI. GENERAL:  Well appearing HEENT:  Pupils equal round and reactive, fundi not visualized, oral mucosa unremarkable NECK:  No jugular venous distention, waveform within normal limits, carotid upstroke brisk and symmetric, no bruits, no thyromegaly LYMPHATICS:  No cervical, inguinal adenopathy LUNGS:  Clear to auscultation bilaterally BACK:  No CVA tenderness CHEST:  Unremarkable HEART:  PMI not displaced or sustained,S1 and S2 within normal limits, no S3, no S4, no clicks, no rubs, *** murmurs ABD:  Flat, positive bowel sounds normal in frequency in pitch, no bruits, no rebound, no guarding,  no midline pulsatile mass, no hepatomegaly, no splenomegaly EXT:  2 plus pulses throughout, no edema, no cyanosis no clubbing SKIN:  No rashes no nodules NEURO:  Cranial nerves II through XII grossly intact, motor grossly intact throughout PSYCH:  Cognitively intact, oriented to person place and time    EKG:  EKG {ACTION; IS/IS IOE:70350093} ordered today. The ekg ordered today demonstrates ***   Recent Labs: 08/27/2022: BUN 11; Creatinine, Ser 1.06; Hemoglobin 16.1; Platelets 225; Potassium 4.2; Sodium 138    Lipid Panel    Component Value Date/Time   CHOL 193 09/28/2018  1536   TRIG 66 09/28/2018 1536   HDL 43 09/28/2018 1536   CHOLHDL 4.5 09/28/2018 1536   VLDL 13 09/28/2018 1536   LDLCALC 137 (H) 09/28/2018 1536      Wt Readings from Last 3 Encounters:  08/27/22 206 lb (93.4 kg)  11/02/20 206 lb 5.6 oz (93.6 kg)  10/30/20 206 lb 4.8 oz (93.6 kg)      Other studies Reviewed: Additional studies/ records that were reviewed today include: ***. Review of the above records demonstrates:  Please see elsewhere in the note.  ***   ASSESSMENT AND PLAN:  Precordial chest pain:  ***   Current medicines are reviewed at length with the patient today.  The patient {ACTIONS; HAS/DOES NOT HAVE:19233} concerns regarding medicines.  The following changes have been made:  {PLAN; NO CHANGE:13088:s}  Labs/ tests ordered today include: *** No orders of the defined types were placed in this encounter.    Disposition:   FU with ***    Signed, Rollene Rotunda, MD  09/14/2022 8:53 PM    [facility]

## 2022-09-17 ENCOUNTER — Ambulatory Visit: Payer: Commercial Managed Care - HMO | Admitting: Cardiology

## 2022-10-02 NOTE — Progress Notes (Deleted)
Cardiology Office Note   Date:  10/02/2022   ID:  Nicholas Horne, DOB 10-31-1971, MRN [id number]  PCP:  Pcp, No  Cardiologist:   None Referring:  ***  No chief complaint on file.     History of Present Illness: Nicholas Horne is a 51 y.o. male who presents for evaluation of precordial chest pain.  He was in the ED for this earlier this month.  I reviewed these records for this visit.     ***     Past Medical History:  Diagnosis Date   Bipolar disorder (manic depression) (HCC)    Cervical radiculopathy    GERD (gastroesophageal reflux disease)    Hypoglycemia    Mild intermittent reactive airway disease    OSA (obstructive sleep apnea)     Past Surgical History:  Procedure Laterality Date   CERVICAL DISC ARTHROPLASTY N/A 11/02/2020   Procedure: Total disc replacement C5-7;  Surgeon: Venita Lick, MD;  Location: [facility] OR;  Service: Orthopedics;  Laterality: N/A;  3 hrs   INCISION AND DRAINAGE OF WOUND Left 05/19/2021   Procedure: IRRIGATION AND DEBRIDEMENT WOUND;  Surgeon: Knute Neu, MD;  Location: [facility] OR;  Service: Plastics;  Laterality: Left;   NASAL FRACTURE SURGERY  1993   TONSILLECTOMY       Current Outpatient Medications  Medication Sig Dispense Refill   albuterol (VENTOLIN HFA) 108 (90 Base) MCG/ACT inhaler Inhale 2 puffs into the lungs every 6 (six) hours as needed for wheezing or shortness of breath (cough). 18 g 0   Multiple Vitamin (MULTIVITAMIN WITH MINERALS) TABS tablet Take 3 tablets by mouth daily.     omeprazole (PRILOSEC) 40 MG capsule Take 1 capsule (40 mg total) by mouth 2 (two) times daily. (Patient not taking: Reported on 05/19/2021) 20 capsule 0   ondansetron (ZOFRAN) 4 MG tablet Take 1 tablet (4 mg total) by mouth every 8 (eight) hours as needed for nausea or vomiting. (Patient not taking: Reported on 05/19/2021) 20 tablet 0   TESTOSTERONE CYPIONATE IM Inject 250 mg into the muscle once a week.     No current facility-administered medications  for this visit.    Allergies:   Abilify [aripiprazole] and Gabapentin    Social History:  The patient  reports that he has been smoking cigarettes. He has never used smokeless tobacco. He reports that he does not currently use drugs after having used the following drugs: Cocaine. He reports that he does not drink alcohol.   Family History:  The patient's ***family history includes Diabetes in his father; Healthy in his mother.    ROS:  Please see the history of present illness.   Otherwise, review of systems are positive for {NONE DEFAULTED:18576}.   All other systems are reviewed and negative.    PHYSICAL EXAM: VS:  There were no vitals taken for this visit. , BMI There is no height or weight on file to calculate BMI. GENERAL:  Well appearing HEENT:  Pupils equal round and reactive, fundi not visualized, oral mucosa unremarkable NECK:  No jugular venous distention, waveform within normal limits, carotid upstroke brisk and symmetric, no bruits, no thyromegaly LYMPHATICS:  No cervical, inguinal adenopathy LUNGS:  Clear to auscultation bilaterally BACK:  No CVA tenderness CHEST:  Unremarkable HEART:  PMI not displaced or sustained,S1 and S2 within normal limits, no S3, no S4, no clicks, no rubs, *** murmurs ABD:  Flat, positive bowel sounds normal in frequency in pitch, no bruits, no rebound, no guarding,  no midline pulsatile mass, no hepatomegaly, no splenomegaly EXT:  2 plus pulses throughout, no edema, no cyanosis no clubbing SKIN:  No rashes no nodules NEURO:  Cranial nerves II through XII grossly intact, motor grossly intact throughout PSYCH:  Cognitively intact, oriented to person place and time    EKG:  EKG {ACTION; IS/IS EAV:[id number]} ordered today. The ekg ordered today demonstrates ***   Recent Labs: 08/27/2022: BUN 11; Creatinine, Ser 1.06; Hemoglobin 16.1; Platelets 225; Potassium 4.2; Sodium 138    Lipid Panel    Component Value Date/Time   CHOL 193 09/28/2018  1536   TRIG 66 09/28/2018 1536   HDL 43 09/28/2018 1536   CHOLHDL 4.5 09/28/2018 1536   VLDL 13 09/28/2018 1536   LDLCALC 137 (H) 09/28/2018 1536      Wt Readings from Last 3 Encounters:  08/27/22 206 lb (93.4 kg)  11/02/20 206 lb 5.6 oz (93.6 kg)  10/30/20 206 lb 4.8 oz (93.6 kg)      Other studies Reviewed: Additional studies/ records that were reviewed today include: ***. Review of the above records demonstrates:  Please see elsewhere in the note.  ***   ASSESSMENT AND PLAN:  Precordial chest pain:  ***   Current medicines are reviewed at length with the patient today.  The patient {ACTIONS; HAS/DOES NOT HAVE:19233} concerns regarding medicines.  The following changes have been made:  {PLAN; NO CHANGE:13088:s}  Labs/ tests ordered today include: *** No orders of the defined types were placed in this encounter.    Disposition:   FU with ***    Signed, Rollene Rotunda, MD  10/02/2022 9:36 PM    [facility]

## 2022-10-04 ENCOUNTER — Ambulatory Visit: Payer: Commercial Managed Care - HMO | Admitting: Cardiology

## 2022-10-14 ENCOUNTER — Ambulatory Visit: Payer: Commercial Managed Care - HMO | Attending: Cardiology | Admitting: Internal Medicine

## 2022-10-14 NOTE — Progress Notes (Deleted)
Cardiology Office Note:    Date:  10/14/2022   ID:  Nicholas Horne, DOB [birth year], MRN [id number]  PCP:  Pcp, No   [facility] Providers Cardiologist:  None { Click to update primary MD,subspecialty MD or APP then REFRESH:1}    Referring MD: Garlon Hatchet, PA-C   No chief complaint on file. ***  History of Present Illness:    Nicholas Horne is a 51 y.o. male with a hx of GERD, bipolar disorder, sleep apnea *** referral from the ED with CP.   Per ED documentation:  States initial symptoms presented Saturday night where he felt like he just could not catch his breath, felt sweaty, nauseated, but never threw up.  States he thought it was related to asthma.  Symptoms improved somewhat Sunday but worsened again yesterday.  Went to UC thinking may be he just needed an inhaler but sent here for further evaluation.  He did receive aspirin and nitroglycerin at urgent care and pain improved from 8/10 to 5/10 but still feels central chest pressure and feels like breathing is not at baseline. Has remote hx of cocaine use     Past Medical History:  Diagnosis Date   Bipolar disorder (manic depression) (HCC)    Cervical radiculopathy    GERD (gastroesophageal reflux disease)    Hypoglycemia    Mild intermittent reactive airway disease    OSA (obstructive sleep apnea)     Past Surgical History:  Procedure Laterality Date   CERVICAL DISC ARTHROPLASTY N/A 11/02/2020   Procedure: Total disc replacement C5-7;  Surgeon: Venita Lick, MD;  Location: [facility] OR;  Service: Orthopedics;  Laterality: N/A;  3 hrs   INCISION AND DRAINAGE OF WOUND Left 05/19/2021   Procedure: IRRIGATION AND DEBRIDEMENT WOUND;  Surgeon: Knute Neu, MD;  Location: [facility] OR;  Service: Plastics;  Laterality: Left;   NASAL FRACTURE SURGERY  1993   TONSILLECTOMY      Current Medications: No outpatient medications have been marked as taking for the 10/14/22 encounter (Appointment) with Maisie Fus, MD.      Allergies:   Abilify [aripiprazole] and Gabapentin   Social History   Socioeconomic History   Marital status: Divorced    Spouse name: Not on file   Number of children: Not on file   Years of education: Not on file   Highest education level: Not on file  Occupational History   Not on file  Tobacco Use   Smoking status: Some Days    Types: Cigarettes   Smokeless tobacco: Never  Vaping Use   Vaping Use: Never used  Substance and Sexual Activity   Alcohol use: No   Drug use: Not Currently    Types: Cocaine    Comment: clean 5 yrs   Sexual activity: Not on file  Other Topics Concern   Not on file  Social History Narrative   Not on file   Social Determinants of Health   Financial Resource Strain: Not on file  Food Insecurity: Not on file  Transportation Needs: Not on file  Physical Activity: Not on file  Stress: Not on file  Social Connections: Not on file     Family History: The patient's ***family history includes Diabetes in his father; Healthy in his mother.  ROS:   Please see the history of present illness.    *** All other systems reviewed and are negative.  EKGs/Labs/Other Studies Reviewed:    The following studies were reviewed today: ***  EKG:  EKG is *** ordered today.  The ekg ordered today demonstrates ***  Recent Labs: 08/27/2022: BUN 11; Creatinine, Ser 1.06; Hemoglobin 16.1; Platelets 225; Potassium 4.2; Sodium 138  Recent Lipid Panel    Component Value Date/Time   CHOL 193 09/28/2018 1536   TRIG 66 09/28/2018 1536   HDL 43 09/28/2018 1536   CHOLHDL 4.5 09/28/2018 1536   VLDL 13 09/28/2018 1536   LDLCALC 137 (H) 09/28/2018 1536     Risk Assessment/Calculations:   {Does this patient have ATRIAL FIBRILLATION?:[id number]}  No BP recorded.  {Refresh Note OR Click here to enter BP  :1}***         Physical Exam:    VS:  There were no vitals taken for this visit.    Wt Readings from Last 3 Encounters:  08/27/22 206 lb (93.4 kg)   11/02/20 206 lb 5.6 oz (93.6 kg)  10/30/20 206 lb 4.8 oz (93.6 kg)     GEN: *** Well nourished, well developed in no acute distress HEENT: Normal NECK: No JVD; No carotid bruits LYMPHATICS: No lymphadenopathy CARDIAC: ***RRR, no murmurs, rubs, gallops RESPIRATORY:  Clear to auscultation without rales, wheezing or rhonchi  ABDOMEN: Soft, non-tender, non-distended MUSCULOSKELETAL:  No edema; No deformity  SKIN: Warm and dry NEUROLOGIC:  Alert and oriented x 3 PSYCHIATRIC:  Normal affect   ASSESSMENT:    No diagnosis found. PLAN:    In order of problems listed above:  ***      {Are you ordering a CV Procedure (e.g. stress test, cath, DCCV, TEE, etc)?   Press F2        :[id number]}    Medication Adjustments/Labs and Tests Ordered: Current medicines are reviewed at length with the patient today.  Concerns regarding medicines are outlined above.  No orders of the defined types were placed in this encounter.  No orders of the defined types were placed in this encounter.   There are no Patient Instructions on file for this visit.   Signed, Maisie Fus, MD  10/14/2022 1:22 PM    [facility]

## 2022-10-24 ENCOUNTER — Ambulatory Visit: Payer: Commercial Managed Care - HMO | Attending: Cardiology | Admitting: Cardiology

## 2022-12-09 ENCOUNTER — Ambulatory Visit: Payer: Commercial Managed Care - HMO | Admitting: Family Medicine

## 2023-02-22 ENCOUNTER — Encounter: Payer: Self-pay | Admitting: Emergency Medicine

## 2023-02-22 ENCOUNTER — Ambulatory Visit
Admission: EM | Admit: 2023-02-22 | Discharge: 2023-02-22 | Disposition: A | Discharge status: Home / Self Care | Payer: 59 | Attending: Physician Assistant | Admitting: Physician Assistant

## 2023-02-22 ENCOUNTER — Other Ambulatory Visit: Payer: Self-pay

## 2023-02-22 DIAGNOSIS — L02219 Cutaneous abscess of trunk, unspecified: Secondary | ICD-10-CM | POA: Diagnosis not present

## 2023-02-22 DIAGNOSIS — L03319 Cellulitis of trunk, unspecified: Secondary | ICD-10-CM

## 2023-02-22 MED ORDER — CEPHALEXIN 500 MG PO CAPS: 500.0000 mg | ORAL_CAPSULE | Freq: Three times a day (TID) | ORAL | 0 refills | Status: DC

## 2023-02-22 MED ORDER — SULFAMETHOXAZOLE-TRIMETHOPRIM 800-160 MG PO TABS: 1.0000 | ORAL_TABLET | Freq: Two times a day (BID) | ORAL | 0 refills | Status: AC

## 2023-02-22 NOTE — ED Triage Notes (Signed)
Pt here for possible mass/abscess to right lower back with body aches x 3 days

## 2023-02-22 NOTE — Discharge Instructions (Signed)
I am concerned that you have an infection that is causing your symptoms.  Please start cephalexin 3 times daily for 7 days and sulfamethoxazole-trimethoprim twice daily for 7 days.  Use warm compress on this area.  Alternate Tylenol and ibuprofen.  Make sure that you rest and drink plenty of fluids.  You should follow-up if your symptoms or not significantly improved within a few days.  Use warm compress on this lesion.  If anything changes or worsens and the lesion becomes larger, red, more painful or you develop any systemic symptoms including fever, nausea, vomiting you need to be seen immediately.

## 2023-02-22 NOTE — ED Provider Notes (Signed)
[name]    CSN: [id number] Arrival date & time: 02/22/23  1345      History   Chief Complaint Chief Complaint  Patient presents with   Mass    HPI ARSENE DESHAZIER is a 52 y.o. male.   Patient presents today with a 5-day history of feeling very poorly.  He reports body aches but denies any fever, nausea, vomiting.  He initially called a telehealth service thinking that he needed a chest x-ray because he was feeling poorly but was unable to arrange this prompting evaluation with [place].  Reports that over the past 3 days he has had increasing lesion in his left lower back.  He does give himself testosterone injections but has not had an injection in this area.  Denies any known bite or injury.  He has not tried any over-the-counter medication for symptom management.  Denies history of recurrent skin infections including MRSA.  Denies any recent antibiotics.  Denies history of diabetes or immunosuppression.  He has missed work as a result of symptoms.  Reports pain is rated 8 on a 0-10 pain scale, localized to affected area, described as sharp, worse with palpation or laying in certain positions, no alleviating factors identified.    Past Medical History:  Diagnosis Date   Bipolar disorder (manic depression) ([place])    Cervical radiculopathy    GERD (gastroesophageal reflux disease)    Hypoglycemia    Mild intermittent reactive airway disease    OSA (obstructive sleep apnea)     Patient Active Problem List   Diagnosis Date Noted   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   OSA (obstructive sleep apnea) 10/30/2020   Tobacco use disorder 09/29/2018   Cocaine use disorder, moderate, dependence ([place]) 09/28/2018   Suicidal ideation 09/28/2018   Bipolar I disorder, most recent episode depressed, severe without psychotic features ([place]) 09/28/2018    Past Surgical History:  Procedure Laterality Date   CERVICAL DISC ARTHROPLASTY N/A 11/02/2020   Procedure: Total disc  replacement C5-7;  Surgeon: Melina Schools, MD;  Location: [facility];  Service: Orthopedics;  Laterality: N/A;  3 hrs   INCISION AND DRAINAGE OF WOUND Left 05/19/2021   Procedure: IRRIGATION AND DEBRIDEMENT WOUND;  Surgeon: Dayna Barker, MD;  Location: [place];  Service: Plastics;  Laterality: Left;   NASAL FRACTURE SURGERY  1993   TONSILLECTOMY         Home Medications    Prior to Admission medications   Medication Sig Start Date End Date Taking? Authorizing Provider  cephALEXin (KEFLEX) 500 MG capsule Take 1 capsule (500 mg total) by mouth 3 (three) times daily. 02/22/23  Yes Amor Packard K, PA-C  sulfamethoxazole-trimethoprim (BACTRIM DS) 800-160 MG tablet Take 1 tablet by mouth 2 (two) times daily for 7 days. 02/22/23 03/01/23 Yes Jamyah Folk K, PA-C  albuterol (VENTOLIN HFA) 108 (90 Base) MCG/ACT inhaler Inhale 2 puffs into the lungs every 6 (six) hours as needed for wheezing or shortness of breath (cough). 09/14/21   Dorna Mai, MD  Multiple Vitamin (MULTIVITAMIN WITH MINERALS) TABS tablet Take 3 tablets by mouth daily.    [provider]  omeprazole (PRILOSEC) 40 MG capsule Take 1 capsule (40 mg total) by mouth 2 (two) times daily. Patient not taking: Reported on 05/19/2021 10/18/20   Nicolette Bang, MD  ondansetron (ZOFRAN) 4 MG tablet Take 1 tablet (4 mg total) by mouth every 8 (eight) hours as needed for nausea or vomiting. Patient not taking: Reported on 05/19/2021 11/02/20  Melina Schools, MD  TESTOSTERONE CYPIONATE IM Inject 250 mg into the muscle once a week.    [provider]    Family History Family History  Problem Relation Age of Onset   Healthy Mother    Diabetes Father     Social History Social History   Tobacco Use   Smoking status: Some Days    Types: Cigarettes   Smokeless tobacco: Never  Vaping Use   Vaping Use: Never used  Substance Use Topics   Alcohol use: No   Drug use: Not Currently    Types: Cocaine    Comment: clean 5  yrs     Allergies   Abilify [aripiprazole] and Gabapentin   Review of Systems Review of Systems  Constitutional:  Positive for activity change, chills and fatigue. Negative for appetite change and fever.  Gastrointestinal:  Negative for abdominal pain, diarrhea, nausea and vomiting.  Musculoskeletal:  Positive for arthralgias and myalgias.  Skin:  Negative for color change and wound.     Physical Exam Triage Vital Signs ED Triage Vitals  Enc Vitals Group     BP 02/22/23 1530 134/76     Pulse Rate 02/22/23 1530 78     Resp 02/22/23 1530 18     Temp 02/22/23 1530 97.6 F (36.4 C)     Temp Source 02/22/23 1530 Oral     SpO2 02/22/23 1530 98 %     Weight --      Height --      Head Circumference --      Peak Flow --      Pain Score 02/22/23 1531 8     Pain Loc --      Pain Edu? --      Excl. in [place]? --    No data found.  Updated Vital Signs BP 134/76 (BP Location: Left Arm)   Pulse 78   Temp 97.6 F (36.4 C) (Oral)   Resp 18   SpO2 98%   Visual Acuity Right Eye Distance:   Left Eye Distance:   Bilateral Distance:    Right Eye Near:   Left Eye Near:    Bilateral Near:     Physical Exam Vitals reviewed.  Constitutional:      General: He is awake.     Appearance: Normal appearance. He is well-developed. He is not ill-appearing.     Comments: Very pleasant male appears stated age no acute distress sitting comfortably in exam room  HENT:     Head: Normocephalic and atraumatic.  Cardiovascular:     Rate and Rhythm: Normal rate and regular rhythm.     Heart sounds: Normal heart sounds, S1 normal and S2 normal. No murmur heard. Pulmonary:     Effort: Pulmonary effort is normal.     Breath sounds: Normal breath sounds. No stridor. No wheezing, rhonchi or rales.     Comments: Clear to auscultation bilaterally Abdominal:     General: Bowel sounds are normal.     Palpations: Abdomen is soft.     Tenderness: There is no abdominal tenderness. There is no right  CVA tenderness, left CVA tenderness, guarding or rebound.     Comments: Benign abdominal exam  Musculoskeletal:     Cervical back: No tenderness or bony tenderness.     Thoracic back: Tenderness present. No bony tenderness.     Lumbar back: No tenderness or bony tenderness.       Back:  Neurological:     Mental Status:  He is alert.  Psychiatric:        Behavior: Behavior is cooperative.      UC Treatments / Results  Labs (all labs ordered are listed, but only abnormal results are displayed) Labs Reviewed - No data to display  EKG   Radiology No results found.  Procedures Procedures (including critical care time)  Medications Ordered in UC Medications - No data to display  Initial Impression / Assessment and Plan / UC Course  I have reviewed the triage vital signs and the nursing notes.  Pertinent labs & imaging results that were available during my care of the patient were reviewed by me and considered in my medical decision making (see chart for details).     Patient is well-appearing, afebrile, nontoxic, nontachycardic.  Abscess with overlying cellulitis noted on left lower back.  This area is indurated without fluctuance not amenable to I&D in clinic.  Will treat with antibiotics.  He was started on cephalexin as well as sulfamethoxazole-trimethoprim.  Discussed that if he develops any rash or oral lesions you stop the medication to be seen immediately.  He is to use to a warm compress on the area to encourage drainage.  Recommend he alternate Tylenol and ibuprofen for pain.  He is to rest and drink plenty of fluid.  If his symptoms are not significantly improved within a few days he is to return for reevaluation.  We discussed signs that would warrant considering I&D and reevaluation.  If he has any worsening or changing symptoms including enlarging lesion, spread of erythema, fever, nausea, vomiting, weakness he needs to be seen immediately.  Strict return precautions  given.  Work excuse note provided.  Final Clinical Impressions(s) / UC Diagnoses   Final diagnoses:  Cellulitis and abscess of trunk     Discharge Instructions      I am concerned that you have an infection that is causing your symptoms.  Please start cephalexin 3 times daily for 7 days and sulfamethoxazole-trimethoprim twice daily for 7 days.  Use warm compress on this area.  Alternate Tylenol and ibuprofen.  Make sure that you rest and drink plenty of fluids.  You should follow-up if your symptoms or not significantly improved within a few days.  Use warm compress on this lesion.  If anything changes or worsens and the lesion becomes larger, red, more painful or you develop any systemic symptoms including fever, nausea, vomiting you need to be seen immediately.     ED Prescriptions     Medication Sig Dispense Auth. Provider   sulfamethoxazole-trimethoprim (BACTRIM DS) 800-160 MG tablet Take 1 tablet by mouth 2 (two) times daily for 7 days. 14 tablet Donella Pascarella K, PA-C   cephALEXin (KEFLEX) 500 MG capsule Take 1 capsule (500 mg total) by mouth 3 (three) times daily. 21 capsule Wynee Matarazzo K, PA-C      PDMP not reviewed this encounter.   Terrilee Croak, PA-C 02/22/23 1601

## 2023-02-26 ENCOUNTER — Ambulatory Visit
Admission: EM | Admit: 2023-02-26 | Discharge: 2023-02-26 | Disposition: A | Discharge status: Home / Self Care | Payer: Commercial Managed Care - HMO

## 2023-02-26 DIAGNOSIS — L03319 Cellulitis of trunk, unspecified: Secondary | ICD-10-CM | POA: Diagnosis not present

## 2023-02-26 DIAGNOSIS — L02219 Cutaneous abscess of trunk, unspecified: Secondary | ICD-10-CM | POA: Diagnosis not present

## 2023-02-26 NOTE — ED Provider Notes (Signed)
[name]    CSN: [id number] Arrival date & time: 02/26/23  0841      History   Chief Complaint Chief Complaint  Patient presents with   Follow-up    HPI Nicholas Horne is a 52 y.o. male.   Patient presents for evaluation requesting work note due to infection to the lower back.  Was seen here in urgent care 4 days ago, currently taking Bactrim and cephalexin for cellulitis and abscess, endorses that symptoms are improving and that fever chills and bodyaches have began to dissipate.  Is still experiencing tenderness to the left lower back and increased fatigue.    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   OSA (obstructive sleep apnea) 10/30/2020   Tobacco use disorder 09/29/2018   Cocaine use disorder, moderate, dependence 09/28/2018   Suicidal ideation 09/28/2018   Bipolar I disorder, most recent episode depressed, severe without psychotic features 09/28/2018    Past Surgical History:  Procedure Laterality Date   CERVICAL DISC ARTHROPLASTY N/A 11/02/2020   Procedure: Total disc replacement C5-7;  Surgeon: Melina Schools, MD;  Location: [facility];  Service: Orthopedics;  Laterality: N/A;  3 hrs   INCISION AND DRAINAGE OF WOUND Left 05/19/2021   Procedure: IRRIGATION AND DEBRIDEMENT WOUND;  Surgeon: Dayna Barker, MD;  Location: [facility];  Service: Plastics;  Laterality: Left;   NASAL FRACTURE SURGERY  1993   TONSILLECTOMY         Home Medications    Prior to Admission medications   Medication Sig Start Date End Date Taking? Authorizing Provider  albuterol (VENTOLIN HFA) 108 (90 Base) MCG/ACT inhaler Inhale 2 puffs into the lungs every 6 (six) hours as needed for  wheezing or shortness of breath (cough). 09/14/21   Dorna Mai, MD  cephALEXin (KEFLEX) 500 MG capsule Take 1 capsule (500 mg total) by mouth 3 (three) times daily. 02/22/23   Raspet, Derry Skill, PA-C  Multiple Vitamin (MULTIVITAMIN WITH MINERALS) TABS tablet Take 3 tablets by mouth daily.    [provider]  omeprazole (PRILOSEC) 40 MG capsule Take 1 capsule (40 mg total) by mouth 2 (two) times daily. Patient not taking: Reported on 05/19/2021 10/18/20   Nicolette Bang, MD  ondansetron (ZOFRAN) 4 MG tablet Take 1 tablet (4 mg total) by mouth every 8 (eight) hours as needed for nausea or vomiting. Patient not taking: Reported on 05/19/2021 11/02/20   Melina Schools, MD  sulfamethoxazole-trimethoprim (BACTRIM DS) 800-160 MG tablet Take 1 tablet by mouth 2 (two) times daily for 7 days. 02/22/23 03/01/23  Raspet, Derry Skill, PA-C  TESTOSTERONE CYPIONATE IM Inject 250 mg into the muscle once a week.    [provider]    Family History Family History  Problem Relation Age of Onset   Healthy Mother    Diabetes Father     Social History Social History   Tobacco Use   Smoking status: Some Days    Types: Cigarettes   Smokeless tobacco: Never  Vaping Use   Vaping Use: Never used  Substance Use Topics   Alcohol use: No   Drug use: Not Currently    Types: Cocaine    Comment:  clean 5 yrs     Allergies   Abilify [aripiprazole] and Gabapentin   Review of Systems Review of Systems   Physical Exam Triage Vital Signs ED Triage Vitals  Enc Vitals Group     BP 02/26/23 0940 (!) 142/77     Pulse Rate 02/26/23 0940 78     Resp 02/26/23 0940 18     Temp 02/26/23 0940 97.7 F (36.5 C)     Temp Source 02/26/23 0940 Oral     SpO2 02/26/23 0940 95 %     Weight --      Height --      Head Circumference --      Peak Flow --      Pain Score 02/26/23 0938 2     Pain Loc --      Pain Edu? --      Excl. in [place]? --    No data found.  Updated Vital Signs BP (!) 142/77  (BP Location: Left Arm)   Pulse 78   Temp 97.7 F (36.5 C) (Oral)   Resp 18   SpO2 95%   Visual Acuity Right Eye Distance:   Left Eye Distance:   Bilateral Distance:    Right Eye Near:   Left Eye Near:    Bilateral Near:     Physical Exam Constitutional:      Appearance: Normal appearance.  Eyes:     Extraocular Movements: Extraocular movements intact.  Pulmonary:     Effort: Pulmonary effort is normal.  Skin:    Comments: 2 cm fluctuant nodule present to the left lower lumbar region, no erythema, swelling present on exam  Neurological:     Mental Status: He is alert and oriented to person, place, and time. Mental status is at baseline.      UC Treatments / Results  Labs (all labs ordered are listed, but only abnormal results are displayed) Labs Reviewed - No data to display  EKG   Radiology No results found.  Procedures Procedures (including critical care time)  Medications Ordered in UC Medications - No data to display  Initial Impression / Assessment and Plan / UC Course  I have reviewed the triage vital signs and the nursing notes.  Pertinent labs & imaging results that were available during my care of the patient were reviewed by me and considered in my medical decision making (see chart for details).  Cellulitis and abscess of trunk  Symptoms seem to be improved, nodule is smaller and no erythema is present on the overlying skin, advised continued use of antibiotic as well as over-the-counter analgesics and heating pad for pain, work note given, may follow-up with urgent care for any concerns regarding Final Clinical Impressions(s) / UC Diagnoses   Final diagnoses:  None   Discharge Instructions   None    ED Prescriptions   None    PDMP not reviewed this encounter.   Hans Eden, NP 02/26/23 1109

## 2023-02-26 NOTE — ED Triage Notes (Signed)
Pt presents for follow up after being treated with antibiotics for an abscess; pt states the medication has him feeling greatly fatigued and he needs a extra few days off to complete antibiotics.

## 2023-02-26 NOTE — Discharge Instructions (Signed)
Based on description of the area of concern during initial evaluation, site appears to be improved as there is no redness on the overlying skin  Continue taking antibiotics as directed  Fatigue is most likely result of body fighting infection and should improve as this clears  May continue use of Tylenol Motrin and warm heating pad over the affected area  May follow-up with his urgent care as needed for any concerns regarding healing

## 2023-06-09 ENCOUNTER — Emergency Department: Payer: Commercial Managed Care - PPO

## 2023-06-09 ENCOUNTER — Other Ambulatory Visit: Payer: Self-pay

## 2023-06-09 ENCOUNTER — Emergency Department
Admission: EM | Admit: 2023-06-09 | Discharge: 2023-06-09 | Disposition: A | Discharge status: Home / Self Care | Payer: Commercial Managed Care - PPO | Attending: Emergency Medicine | Admitting: Emergency Medicine

## 2023-06-09 DIAGNOSIS — R079 Chest pain, unspecified: Secondary | ICD-10-CM | POA: Insufficient documentation

## 2023-06-09 DIAGNOSIS — F17210 Nicotine dependence, cigarettes, uncomplicated: Secondary | ICD-10-CM | POA: Insufficient documentation

## 2023-06-09 DIAGNOSIS — R5383 Other fatigue: Secondary | ICD-10-CM | POA: Diagnosis not present

## 2023-06-09 LAB — CBC
HCT: 45.3 % (ref 39.0–52.0)
Hemoglobin: 15.9 g/dL (ref 13.0–17.0)
MCH: 30.7 pg (ref 26.0–34.0)
MCHC: 35.1 g/dL (ref 30.0–36.0)
MCV: 87.5 fL (ref 80.0–100.0)
Platelets: 287 10*3/uL (ref 150–400)
RBC: 5.18 MIL/uL (ref 4.22–5.81)
RDW: 13.3 % (ref 11.5–15.5)
WBC: 11.9 10*3/uL — ABNORMAL HIGH (ref 4.0–10.5)
nRBC: 0 % (ref 0.0–0.2)

## 2023-06-09 LAB — BASIC METABOLIC PANEL
Anion gap: 10 (ref 5–15)
BUN: 17 mg/dL (ref 6–20)
CO2: 23 mmol/L (ref 22–32)
Calcium: 8.9 mg/dL (ref 8.9–10.3)
Chloride: 105 mmol/L (ref 98–111)
Creatinine, Ser: 0.99 mg/dL (ref 0.61–1.24)
GFR, Estimated: >60 mL/min (ref >60)
Glucose, Bld: 125 mg/dL — ABNORMAL HIGH (ref 70–99)
Potassium: 3.3 mmol/L — ABNORMAL LOW (ref 3.5–5.1)
Sodium: 138 mmol/L (ref 135–145)

## 2023-06-09 LAB — TROPONIN I (HIGH SENSITIVITY)
Troponin I (High Sensitivity): 12 ng/L (ref <18)
Troponin I (High Sensitivity): 11 ng/L (ref <18)

## 2023-06-09 NOTE — Discharge Instructions (Addendum)
Fortunately your testing in the emergency department did not show any emergency conditions to account for your Tums.  However as we discussed is important to follow-up with a regular primary doctor as well as a cardiologist for which I have made referrals to you.  They will reach out to you to schedule an appointment.  Thank you for choosing [place] for your health care today!  Please see your primary doctor this week for a follow up appointment.   If you have any new, worsening, or unexpected symptoms call your doctor right away or come back to the emergency department for reevaluation.  It was my pleasure to care for you today.   Daneil Dan Modesto Charon, MD

## 2023-06-09 NOTE — ED Triage Notes (Signed)
Pt sts that he woke up today and started to have severe chest pain. Pt sts that he felt like he was going to pass out and was very diaphoretic and had left arm pain. Pt sts that this has happened before and they thought something was wrong on the posterior side of his heart.

## 2023-06-09 NOTE — ED Provider Notes (Signed)
[facility] Provider Note    Event Date/Time   First MD Initiated Contact with Patient 06/09/23 1502     (approximate)   History   Chest Pain   HPI  Nicholas Horne is a 52 y.o. male   Past medical history of documented medical history of bipolar disorder, cervical radiculopathy, GERD, mild intermittent reactive airway disease, OSA who does not see a primary doctor who presents to the emergency department with chest pain and fatigue.  He states that intermittently over the last several weeks while at rest he will get severe substernal chest pain that radiates down the left arm.  This will last for a few minutes to 1 hour at a time and spontaneously resolved.  He uses cocaine occasionally but denies any temporal association with his pain and cocaine use.  His last episode was earlier today.  It is completely resolved and he is asymptomatic now.  His last cocaine use was Friday night.  He is an occasional smoker of cigarettes.  No other drug use or alcohol use.  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  He has had a mild cough but no fever.  He denies any GI or GU complaints.  Has been seen by doctors in the past for his chest pain and referred to a cardiologist but has never followed through to meet with a cardiologist because of work obligations.  Independent Historian contributed to assessment above: His mother is at bedside to corroborate information given above.       Physical Exam   Triage Vital Signs: ED Triage Vitals  Encounter Vitals Group     BP 06/09/23 1206 (!) 174/120     Systolic BP Percentile --      Diastolic BP Percentile --      Pulse Rate 06/09/23 1206 90     Resp 06/09/23 1206 18     Temp 06/09/23 1206 98.6 F (37 C)     Temp Source 06/09/23 1206 Oral     SpO2 06/09/23 1206 97 %     Weight 06/09/23 1207 180 lb (81.6 kg)     Height  06/09/23 1207 5\' 10"  (1.778 m)     Head Circumference --      Peak Flow --      Pain Score 06/09/23 1206 5     Pain Loc --      Pain Education --      Exclude from Growth Chart --     Most recent vital signs: Vitals:   06/09/23 1447 06/09/23 1500  BP:  125/66  Pulse: (!) 59   Resp:  18  Temp:    SpO2:  100%    General: Awake, no distress.  CV:  Good peripheral perfusion.  Resp:  Normal effort.  Abd:  No distention.  Other:  Awake comfortable with normal hemodynamics, afebrile no respiratory distress, clear lungs without focality or wheezing.  Soft nontender abdomen to deep palpation all quadrants.  Radial pulses intact bilateral wrists, equal.  Euvolemic appearing.  No unilateral leg swelling.   ED Results / Procedures / Treatments   Labs (all labs ordered are listed, but only abnormal results are displayed) Labs Reviewed  BASIC METABOLIC PANEL - Abnormal; Notable for the following components:      Result Value   Potassium 3.3 (*)    Glucose, Bld 125 (*)  All other components within normal limits  CBC - Abnormal; Notable for the following components:   WBC 11.9 (*)    All other components within normal limits  TROPONIN I (HIGH SENSITIVITY)  TROPONIN I (HIGH SENSITIVITY)     I ordered and reviewed the above labs they are notable for blood cell count mildly elevated 11.9, otherwise cell counts and electrolytes largely unremarkable.  His initial troponin is 12.  EKG  ED ECG REPORT I, Pilar Jarvis, the attending physician, personally viewed and interpreted this ECG.   Date: 06/09/2023  EKG Time: 1209  Rate: 86  Rhythm: NSR  Axis: LAD  Intervals:nl  ST&T Change: no stemi    RADIOLOGY I independently reviewed and interpreted chest x-ray and I see no obvious focalities or pneumothorax   PROCEDURES:  Critical Care performed: No  Procedures   MEDICATIONS ORDERED IN ED: Medications - No data to display   IMPRESSION / MDM / ASSESSMENT AND PLAN / ED  COURSE  I reviewed the triage vital signs and the nursing notes.                                Patient's presentation is most consistent with acute presentation with potential threat to life or bodily function.  Differential diagnosis includes, but is not limited to, ACS, dissection, PE, esophageal spasm, cocaine induced chest pain, or respiratory infection   The patient is on the cardiac monitor to evaluate for evidence of arrhythmia and/or significant heart rate changes.  MDM:   Intermittent chest pain severe rating to left arm in this 52 year old with some risk factors including cocaine use, perhaps other cardiac risk factors but undiagnosed by lack of primary care doctor.  Concern for cardiac ischemia but EKG is nonischemic and first troponin within normal limits will follow-up with serial troponins.  Fortunately he is asymptomatic at this time and given his nonexertional pain that spontaneous resolves it would be very atypical for cardiac ischemia.  This quality and description of symptoms would not match dissection or PE as well.  Has a mild cough but has a clear chest x-ray no respiratory distress and clear lungs so I doubt bacterial pneumonia.  All in all, doubt cardiopulmonary emergency in the setting of intermittent pain, fatigue, and asymptomatic now with normal vital signs and thus far unremarkable workup.  Given his most recent episode was just earlier this afternoon, will follow-up with second troponin and if normal plan will be for discharge and he will require close follow-up with PMD referral and cardiology referral.  I warned him against ongoing cocaine use and advised that he stop using cocaine and follow-up with these referrals, as well as come back to the emergency department with any new or worsening symptoms.         FINAL CLINICAL IMPRESSION(S) / ED DIAGNOSES   Final diagnoses:  Nonspecific chest pain  Other fatigue     Rx / DC Orders   ED Discharge Orders           Ordered    Ambulatory referral to Cardiology       Comments: If you have not heard from the Cardiology office within the next 72 hours please call [phone].   06/09/23 1529    Ambulatory Referral to Primary Care (Establish Care)        06/09/23 1529             Note:  This document  was prepared using Conservation officer, historic buildings and may include unintentional dictation errors.    Pilar Jarvis, MD 06/09/23 [phone]

## 2023-06-09 NOTE — ED Notes (Addendum)
States did coke Friday; pain in chest while coughing and sometimes at random; blurred vision at times; at work in the heat pt "struggles"; cardiology following pt per pt but then states missed appointment x3 for work; urgent care sent pt to [facility] twice recently bc of how his EKG looked per pt; pt's neck, face and chest flushed currently, states feels fatigued and SOB currently, denies being this red usually. Visitor at bedside. Pt's resp reg/unlabored, skin oily in face but not diaphoretic, laying calmly on stretcher. Smokes cig's every once in a while; states alcohol every once in a while. Pt reports the more active he is the more chest pain he has.

## 2023-08-08 ENCOUNTER — Telehealth: Payer: Self-pay

## 2023-08-08 NOTE — Telephone Encounter (Signed)
Attempt to call the patient to inquire about any previous cardiac history. Unable to leave a voice message due to his voice mailbox not being set up. Patient is schedule to Dr. Azucena Cecil on 08/15/23 as a new patient.

## 2023-08-15 ENCOUNTER — Encounter: Payer: Self-pay | Admitting: Cardiology

## 2023-08-15 ENCOUNTER — Other Ambulatory Visit: Payer: Self-pay

## 2023-08-15 ENCOUNTER — Ambulatory Visit: Payer: Commercial Managed Care - PPO | Attending: Cardiology | Admitting: Cardiology

## 2023-08-15 VITALS — BP 120/78 | HR 67 | Ht 70.0 in | Wt 190.8 lb

## 2023-08-15 DIAGNOSIS — F17200 Nicotine dependence, unspecified, uncomplicated: Secondary | ICD-10-CM | POA: Diagnosis not present

## 2023-08-15 DIAGNOSIS — R072 Precordial pain: Secondary | ICD-10-CM | POA: Diagnosis not present

## 2023-08-15 MED ORDER — METOPROLOL TARTRATE 50 MG PO TABS: 50.0000 mg | ORAL_TABLET | Freq: Once | ORAL | 0 refills | Status: DC

## 2023-08-15 NOTE — Patient Instructions (Signed)
Medication Instructions:   Your physician recommends that you continue on your current medications as directed. Please refer to the Current Medication list given to you today.  *If you need a refill on your cardiac medications before your next appointment, please call your pharmacy*   Lab Work:  Your physician recommends you have labs today - BMP  Your provider would like for you to return the day of your Echocardiogram to have the following labs drawn: LIPID.   Please go to [facility] [address] ([facility] [address], [place] [address] You do not need an appointment.  They are open from 7:30 am-4 pm.  Lunch from 1:00 pm- 2:00 pm You WILL need to be fasting.  If you have labs (blood work) drawn today and your tests are completely normal, you will receive your results only by: MyChart Message (if you have MyChart) OR A paper copy in the mail If you have any lab test that is abnormal or we need to change your treatment, we will call you to review the results.   Testing/Procedures:  Your physician has requested that you have an echocardiogram. Echocardiography is a painless test that uses sound waves to create images of your heart. It provides your doctor with information about the size and shape of your heart and how well your heart's chambers and valves are working. This procedure takes approximately one hour. There are no restrictions for this procedure. Please do NOT wear cologne, perfume, aftershave, or lotions (deodorant is allowed). Please arrive 15 minutes prior to your appointment time.    Your cardiac CT will be scheduled at one of the below locations:   [facility] [address] [place], [place] [address] [phone]  OR   [facility] [address] [place], [place] [address] [phone]  If scheduled at [facility] or [facility], please arrive 15 mins early for check-in and test prep.  There is spacious parking and easy access to the radiology department from the [facility] entrance. Please enter here and check-in with the desk attendant.   Please follow these instructions carefully (unless otherwise directed):  An IV will be required for this test and Nitroglycerin will be given.  Hold all erectile dysfunction medications at least 3 days (72 hrs) prior to test. (Ie viagra, cialis, sildenafil, tadalafil, etc)   On the Night Before the Test: Be sure to Drink plenty of water. Do not consume any caffeinated/decaffeinated beverages or chocolate 12 hours prior to your test. Do not take any antihistamines 12 hours prior to your test.  On the Day of the Test: Drink plenty of water until 1 hour prior to the test. Do not eat any food 1 hour prior to test. You may take your regular medications prior to the test.  Take metoprolol (Lopressor) two hours prior to test. If you take Furosemide/Hydrochlorothiazide/Spironolactone, please HOLD on the morning of the test.      After the Test: Drink plenty of water. After receiving IV contrast, you may experience a mild flushed feeling. This is normal. On occasion, you may experience a mild rash up to 24 hours after the test. This is not dangerous. If this occurs, you can take Benadryl 25 mg and increase your fluid intake. If you experience trouble breathing, this can be serious. If it is severe call 911 IMMEDIATELY. If it is mild, please call our office. If you take  any of these medications: Glipizide/Metformin, Avandament, Glucavance, please do not take 48 hours after completing test unless otherwise instructed.  We will call to schedule your test 2-4 weeks out understanding that some insurance companies will need an authorization prior to the service being performed.   For more information and frequently asked questions, please visit our website :  [email or web address]  For non-scheduling related questions, please contact the cardiac imaging nurse navigator should you have any questions/concerns: Cardiac Imaging Nurse Navigators Direct Office Dial: [phone]   For scheduling needs, including cancellations and rescheduling, please call [place], [phone].   Follow-Up: At [facility], you and your health needs are our priority.  As part of our continuing mission to provide you with exceptional heart care, we have created designated Provider Care Teams.  These Care Teams include your primary Cardiologist (physician) and Advanced Practice Providers (APPs -  Physician Assistants and Nurse Practitioners) who all work together to provide you with the care you need, when you need it.  We recommend signing up for the patient portal called "MyChart".  Sign up information is provided on this After Visit Summary.  MyChart is used to connect with patients for Virtual Visits (Telemedicine).  Patients are able to view lab/test results, encounter notes, upcoming appointments, etc.  Non-urgent messages can be sent to your provider as well.   To learn more about what you can do with MyChart, go to [email or web address].    Your next appointment:    After Echocardiogram  Provider:   You may see Debbe Odea, MD or one of the following Advanced Practice Providers on your designated Care Team:   Nicolasa Ducking, NP Eula Listen, PA-C Cadence Fransico Michael, PA-C Charlsie Quest, NP

## 2023-08-15 NOTE — Progress Notes (Signed)
Cardiology Office Note:    Date:  08/15/2023   ID:  Nicholas Horne, DOB Jun 13, 1971, MRN [id number]  PCP:  Aviva Kluver   [facility] Providers Cardiologist:  Debbe Odea, MD     Referring MD: Pilar Jarvis, MD   Chief Complaint  Patient presents with   New Patient (Initial Visit)    Referred by ED for intermittent chest pains.  Patient repots increasing fatigue with intermittent chest pain especially while under stress.   Nicholas Horne is a 52 y.o. male who is being seen today for the evaluation of chest pain at the request of Pilar Jarvis, MD.   History of Present Illness:    Nicholas Horne is a 52 y.o. male with a hx of GERD, current cigarette smoker x 20+ years, former cocaine use presenting with chest pain.  States having on and off chest pain over the past year.  Did not follow-up with cardiology previously due to lack of insurance.  Symptoms occur sometimes with exertion and stress.  Endorse smoking, previously used cocaine.  Denies immediate family history of MI.  Grandfather had an MI in his 89s.  Past Medical History:  Diagnosis Date   Bipolar disorder (manic depression) (HCC)    Cervical radiculopathy    GERD (gastroesophageal reflux disease)    Hypoglycemia    Mild intermittent reactive airway disease    OSA (obstructive sleep apnea)     Past Surgical History:  Procedure Laterality Date   CERVICAL DISC ARTHROPLASTY N/A 11/02/2020   Procedure: Total disc replacement C5-7;  Surgeon: Venita Lick, MD;  Location: [facility] OR;  Service: Orthopedics;  Laterality: N/A;  3 hrs   INCISION AND DRAINAGE OF WOUND Left 05/19/2021   Procedure: IRRIGATION AND DEBRIDEMENT WOUND;  Surgeon: Knute Neu, MD;  Location: [facility] OR;  Service: Plastics;  Laterality: Left;   NASAL FRACTURE SURGERY  1993   TONSILLECTOMY      Current Medications: Current Meds  Medication Sig   metoprolol tartrate (LOPRESSOR) 50 MG tablet Take 1 tablet (50 mg total) by mouth once for 1 dose.  ONE TO TWO HOURS PRIOR TO CARDIAC CTA   TESTOSTERONE CYPIONATE IM Inject 250 mg into the muscle once a week. At pt preference     Allergies:   Abilify [aripiprazole] and Gabapentin   Social History   Socioeconomic History   Marital status: Divorced    Spouse name: Not on file   Number of children: Not on file   Years of education: Not on file   Highest education level: Not on file  Occupational History   Not on file  Tobacco Use   Smoking status: Some Days    Types: Cigarettes   Smokeless tobacco: Never  Vaping Use   Vaping status: Never Used  Substance and Sexual Activity   Alcohol use: No   Drug use: Not Currently    Types: Cocaine    Comment: clean 5 yrs   Sexual activity: Not on file  Other Topics Concern   Not on file  Social History Narrative   Not on file   Social Determinants of Health   Financial Resource Strain: Not on file  Food Insecurity: Not on file  Transportation Needs: Not on file  Physical Activity: Not on file  Stress: Not on file  Social Connections: Not on file     Family History: The patient's family history includes Diabetes in his father; Healthy in his mother; Heart attack in his maternal grandfather.  ROS:  Please see the history of present illness.     All other systems reviewed and are negative.  EKGs/Labs/Other Studies Reviewed:    The following studies were reviewed today:  EKG Interpretation Date/Time:  Friday August 15 2023 09:16:56 EDT Ventricular Rate:  67 PR Interval:  174 QRS Duration:  90 QT Interval:  396 QTC Calculation: 418 R Axis:   51  Text Interpretation: Normal sinus rhythm Normal ECG Confirmed by Debbe Odea ([id number]) on 08/15/2023 9:21:01 AM    Recent Labs: 06/09/2023: BUN 17; Creatinine, Ser 0.99; Hemoglobin 15.9; Platelets 287; Potassium 3.3; Sodium 138  Recent Lipid Panel    Component Value Date/Time   CHOL 193 09/28/2018 1536   TRIG 66 09/28/2018 1536   HDL 43 09/28/2018 1536   CHOLHDL  4.5 09/28/2018 1536   VLDL 13 09/28/2018 1536   LDLCALC 137 (H) 09/28/2018 1536     Risk Assessment/Calculations:             Physical Exam:    VS:  BP 120/78 (BP Location: Left Arm, Patient Position: Sitting, Cuff Size: Large)   Pulse 67   Ht 5\' 10"  (1.778 m)   Wt 190 lb 12.8 oz (86.5 kg)   SpO2 98%   BMI 27.38 kg/m     Wt Readings from Last 3 Encounters:  08/15/23 190 lb 12.8 oz (86.5 kg)  06/09/23 180 lb (81.6 kg)  08/27/22 206 lb (93.4 kg)     GEN:  Well nourished, well developed in no acute distress HEENT: Normal NECK: No JVD; No carotid bruits CARDIAC: RRR, no murmurs, rubs, gallops RESPIRATORY:  Clear to auscultation without rales, wheezing or rhonchi  ABDOMEN: Soft, non-tender, non-distended MUSCULOSKELETAL:  No edema; No deformity  SKIN: Warm and dry NEUROLOGIC:  Alert and oriented x 3 PSYCHIATRIC:  Normal affect   ASSESSMENT:    1. Precordial pain   2. Smoking    PLAN:    In order of problems listed above:  Chest pain, risk factors current smoker.  Obtain lipid panel, echocardiogram, coronary CT. Current smoker, smoking cessation advised.  Follow-up after echo and coronary CT.     Medication Adjustments/Labs and Tests Ordered: Current medicines are reviewed at length with the patient today.  Concerns regarding medicines are outlined above.  Orders Placed This Encounter  Procedures   CT CORONARY MORPH W/CTA COR W/SCORE W/CA W/CM &/OR WO/CM   Basic Metabolic Panel (BMET)   Lipid panel   EKG 12-Lead   ECHOCARDIOGRAM COMPLETE   Meds ordered this encounter  Medications   metoprolol tartrate (LOPRESSOR) 50 MG tablet    Sig: Take 1 tablet (50 mg total) by mouth once for 1 dose. ONE TO TWO HOURS PRIOR TO CARDIAC CTA    Dispense:  1 tablet    Refill:  0    Patient Instructions  Medication Instructions:   Your physician recommends that you continue on your current medications as directed. Please refer to the Current Medication list given to  you today.  *If you need a refill on your cardiac medications before your next appointment, please call your pharmacy*   Lab Work:  Your physician recommends you have labs today - BMP  Your provider would like for you to return the day of your Echocardiogram to have the following labs drawn: LIPID.   Please go to [facility] [address] [address], [place] [address] You do not need an appointment.  They are open from 7:30 am-4 pm.  Lunch from  1:00 pm- 2:00 pm You WILL need to be fasting.  If you have labs (blood work) drawn today and your tests are completely normal, you will receive your results only by: MyChart Message (if you have MyChart) OR A paper copy in the mail If you have any lab test that is abnormal or we need to change your treatment, we will call you to review the results.   Testing/Procedures:  Your physician has requested that you have an echocardiogram. Echocardiography is a painless test that uses sound waves to create images of your heart. It provides your doctor with information about the size and shape of your heart and how well your heart's chambers and valves are working. This procedure takes approximately one hour. There are no restrictions for this procedure. Please do NOT wear cologne, perfume, aftershave, or lotions (deodorant is allowed). Please arrive 15 minutes prior to your appointment time.    Your cardiac CT will be scheduled at one of the below locations:   [facility] [address] [place], [place] [address] [phone]  OR   [facility] [address] [place], [place] [address] [phone]  If scheduled at [facility] or [facility], please arrive 15 mins early for check-in and test prep.  There is spacious parking and easy access to the radiology department from the [facility] Heart and  Vascular entrance. Please enter here and check-in with the desk attendant.   Please follow these instructions carefully (unless otherwise directed):  An IV will be required for this test and Nitroglycerin will be given.  Hold all erectile dysfunction medications at least 3 days (72 hrs) prior to test. (Ie viagra, cialis, sildenafil, tadalafil, etc)   On the Night Before the Test: Be sure to Drink plenty of water. Do not consume any caffeinated/decaffeinated beverages or chocolate 12 hours prior to your test. Do not take any antihistamines 12 hours prior to your test.  On the Day of the Test: Drink plenty of water until 1 hour prior to the test. Do not eat any food 1 hour prior to test. You may take your regular medications prior to the test.  Take metoprolol (Lopressor) two hours prior to test. If you take Furosemide/Hydrochlorothiazide/Spironolactone, please HOLD on the morning of the test.      After the Test: Drink plenty of water. After receiving IV contrast, you may experience a mild flushed feeling. This is normal. On occasion, you may experience a mild rash up to 24 hours after the test. This is not dangerous. If this occurs, you can take Benadryl 25 mg and increase your fluid intake. If you experience trouble breathing, this can be serious. If it is severe call 911 IMMEDIATELY. If it is mild, please call our office. If you take any of these medications: Glipizide/Metformin, Avandament, Glucavance, please do not take 48 hours after completing test unless otherwise instructed.  We will call to schedule your test 2-4 weeks out understanding that some insurance companies will need an authorization prior to the service being performed.   For more information and frequently asked questions, please visit our website : [email or web address]  For non-scheduling related questions, please contact the cardiac imaging nurse navigator should you have any  questions/concerns: Cardiac Imaging Nurse Navigators Direct Office Dial: [phone]   For scheduling needs, including cancellations and rescheduling, please call [place], [phone].   Follow-Up: At [facility], you and your health needs are our priority.  As part of our continuing mission to provide you with exceptional heart care, we have created designated Provider Care Teams.  These Care Teams include your primary Cardiologist (physician) and Advanced Practice Providers (APPs -  Physician Assistants and Nurse Practitioners) who all work together to provide you with the care you need, when you need it.  We recommend signing up for the patient portal called "MyChart".  Sign up information is provided on this After Visit Summary.  MyChart is used to connect with patients for Virtual Visits (Telemedicine).  Patients are able to view lab/test results, encounter notes, upcoming appointments, etc.  Non-urgent messages can be sent to your provider as well.   To learn more about what you can do with MyChart, go to [email or web address].    Your next appointment:    After Echocardiogram  Provider:   You may see Debbe Odea, MD or one of the following Advanced Practice Providers on your designated Care Team:   Nicolasa Ducking, NP Eula Listen, PA-C Cadence Fransico Michael, PA-C Charlsie Quest, NP    Signed, Debbe Odea, MD  08/15/2023 10:11 AM    [facility]

## 2023-08-16 LAB — BASIC METABOLIC PANEL
BUN/Creatinine Ratio: 13 (ref 9–20)
BUN: 12 mg/dL (ref 6–24)
CO2: 25 mmol/L (ref 20–29)
Calcium: 9.2 mg/dL (ref 8.7–10.2)
Chloride: 104 mmol/L (ref 96–106)
Creatinine, Ser: 0.95 mg/dL (ref 0.76–1.27)
Glucose: 80 mg/dL (ref 70–99)
Potassium: 4.7 mmol/L (ref 3.5–5.2)
Sodium: 142 mmol/L (ref 134–144)
eGFR: 97 mL/min/{1.73_m2} (ref >59)

## 2023-08-16 LAB — LIPID PANEL
Chol/HDL Ratio: 4.3 ratio (ref 0.0–5.0)
Cholesterol, Total: 179 mg/dL (ref 100–199)
HDL: 42 mg/dL (ref >39)
LDL Chol Calc (NIH): 106 mg/dL — ABNORMAL HIGH (ref 0–99)
Triglycerides: 180 mg/dL — ABNORMAL HIGH (ref 0–149)
VLDL Cholesterol Cal: 31 mg/dL (ref 5–40)

## 2023-09-02 ENCOUNTER — Encounter (HOSPITAL_COMMUNITY): Payer: Self-pay

## 2023-09-03 ENCOUNTER — Telehealth (HOSPITAL_COMMUNITY): Payer: Self-pay | Admitting: *Deleted

## 2023-09-03 NOTE — Telephone Encounter (Signed)
Attempted to call patient regarding upcoming cardiac CT appointment. Voicemail box not set up to leave a message.  Algernon Mundie RN Navigator Cardiac Imaging [facility] [phone] Office [phone] Cell  

## 2023-09-04 ENCOUNTER — Emergency Department (EMERGENCY_DEPARTMENT_HOSPITAL)
Admission: EM | Admit: 2023-09-04 | Discharge: 2023-09-05 | Disposition: A | Discharge status: Home / Self Care | Payer: Commercial Managed Care - PPO | Attending: Emergency Medicine | Admitting: Emergency Medicine

## 2023-09-04 ENCOUNTER — Other Ambulatory Visit: Payer: Self-pay

## 2023-09-04 ENCOUNTER — Ambulatory Visit: Admission: RE | Admit: 2023-09-04 | Payer: Commercial Managed Care - PPO | Source: Ambulatory Visit

## 2023-09-04 DIAGNOSIS — R45851 Suicidal ideations: Secondary | ICD-10-CM | POA: Insufficient documentation

## 2023-09-04 DIAGNOSIS — F411 Generalized anxiety disorder: Secondary | ICD-10-CM | POA: Insufficient documentation

## 2023-09-04 DIAGNOSIS — F3163 Bipolar disorder, current episode mixed, severe, without psychotic features: Secondary | ICD-10-CM | POA: Insufficient documentation

## 2023-09-04 DIAGNOSIS — F32A Depression, unspecified: Secondary | ICD-10-CM

## 2023-09-04 LAB — COMPREHENSIVE METABOLIC PANEL
ALT: 36 U/L (ref 0–44)
AST: 37 U/L (ref 15–41)
Albumin: 3.7 g/dL (ref 3.5–5.0)
Alkaline Phosphatase: 45 U/L (ref 38–126)
Anion gap: 11 (ref 5–15)
BUN: 16 mg/dL (ref 6–20)
CO2: 24 mmol/L (ref 22–32)
Calcium: 8.7 mg/dL — ABNORMAL LOW (ref 8.9–10.3)
Chloride: 100 mmol/L (ref 98–111)
Creatinine, Ser: 0.97 mg/dL (ref 0.61–1.24)
GFR, Estimated: >60 mL/min (ref >60)
Glucose, Bld: 80 mg/dL (ref 70–99)
Potassium: 3.4 mmol/L — ABNORMAL LOW (ref 3.5–5.1)
Sodium: 135 mmol/L (ref 135–145)
Total Bilirubin: 1.5 mg/dL — ABNORMAL HIGH (ref 0.3–1.2)
Total Protein: 7.1 g/dL (ref 6.5–8.1)

## 2023-09-04 LAB — SALICYLATE LEVEL: Salicylate Lvl: <7 mg/dL — ABNORMAL LOW (ref 7.0–30.0)

## 2023-09-04 LAB — URINE DRUG SCREEN, QUALITATIVE (ARMC ONLY)
Amphetamines, Ur Screen: POSITIVE — AB
Barbiturates, Ur Screen: NOT DETECTED
Benzodiazepine, Ur Scrn: NOT DETECTED
Cannabinoid 50 Ng, Ur ~~LOC~~: NOT DETECTED
Cocaine Metabolite,Ur ~~LOC~~: POSITIVE — AB
MDMA (Ecstasy)Ur Screen: NOT DETECTED
Methadone Scn, Ur: NOT DETECTED
Opiate, Ur Screen: NOT DETECTED
Phencyclidine (PCP) Ur S: NOT DETECTED
Tricyclic, Ur Screen: NOT DETECTED

## 2023-09-04 LAB — CBC
HCT: 46.3 % (ref 39.0–52.0)
Hemoglobin: 15.3 g/dL (ref 13.0–17.0)
MCH: 30.2 pg (ref 26.0–34.0)
MCHC: 33 g/dL (ref 30.0–36.0)
MCV: 91.3 fL (ref 80.0–100.0)
Platelets: 256 10*3/uL (ref 150–400)
RBC: 5.07 MIL/uL (ref 4.22–5.81)
RDW: 16 % — ABNORMAL HIGH (ref 11.5–15.5)
WBC: 10.4 10*3/uL (ref 4.0–10.5)
nRBC: 0 % (ref 0.0–0.2)

## 2023-09-04 LAB — ETHANOL: Alcohol, Ethyl (B): <10 mg/dL (ref <10)

## 2023-09-04 LAB — ACETAMINOPHEN LEVEL: Acetaminophen (Tylenol), Serum: <10 ug/mL — ABNORMAL LOW (ref 10–30)

## 2023-09-04 MED ORDER — ASPIRIN 81 MG PO TBEC
81.0000 mg | DELAYED_RELEASE_TABLET | Freq: Every day | ORAL | Status: DC
  Administered 2023-09-04 – 2023-09-05 (×2): 81 mg via ORAL
  Filled 2023-09-04 (×2): qty 1

## 2023-09-04 MED ORDER — POTASSIUM CHLORIDE CRYS ER 20 MEQ PO TBCR
40.0000 meq | EXTENDED_RELEASE_TABLET | Freq: Once | ORAL | Status: AC
  Administered 2023-09-04: 40 meq via ORAL
  Filled 2023-09-04: qty 2

## 2023-09-04 NOTE — ED Notes (Signed)
Blanket and Water without straw and lid given to Pt.

## 2023-09-04 NOTE — ED Notes (Signed)
Pt received evening snack with fresh ice water in a foam cup. Individual food items opened prior to pt receiving tray. Pt made aware no other food would be handed out unil breakfast tomorrow morning.  

## 2023-09-04 NOTE — BH Assessment (Signed)
Comprehensive Clinical Assessment (CCA) Note  09/04/2023 Nicholas Horne [id number]  Chief Complaint:  Chief Complaint  Patient presents with   Psychiatric Evaluation   Visit Diagnosis: Bipolar   Nicholas Horne is a 52 year old male who presents to the ER due to increase symptoms of depression and thoughts of ending his life. He states he has a history of Bipolar and when he has moments like this, he stops caring about living and is reckless. He is seeking help now, before it gets to that point. He noticed when he rides his motorcycle, he is doing things that may cause him to lose his life, and he isn't concerned or bothered by it. He started using cocaine and alcohol to deal with his emotions. Which what he also has done in the past. He is sleeping more, having crying spells and his motivation to go to work, or even leave the house is decreasing.  He states he has been in denial about the Bipolar dx, but he is able to recognize it and afraid if he doesn't get help, it will have major negative impact on his family.  During the interview, the patient was calm, cooperative and pleasant. Several times he became tearful. He denies HI and AV/H.  CCA Screening, Triage and Referral (STR)  Patient Reported Information How did you hear about [place]? Self  What Is the Reason for Your Visit/Call Today? Patient depression increasing and having SI.  How Long Has This Been Causing You Problems? 1 wk - 1 month  What Do You Feel Would Help You the Most Today? Treatment for Depression or other mood problem; Alcohol or Drug Use Treatment   Have You Recently Had Any Thoughts About Hurting Yourself? Yes  Are You Planning to Commit Suicide/Harm Yourself At This time? Yes   Flowsheet Row ED from 09/04/2023 in [facility] Emergency Department at [facility] ED from 06/09/2023 in [facility] Department at [facility] ED from 02/26/2023 in [facility] at [facility]  C-SSRS RISK CATEGORY High Risk No Risk No Risk       Have you Recently Had Thoughts About Hurting Someone Nicholas Horne? No  Are You Planning to Harm Someone at This Time? No  Explanation: No data recorded  Have You Used Any Alcohol or Drugs in the Past 24 Hours? Yes  What Did You Use and How Much? Cocaine   Do You Currently Have a Therapist/Psychiatrist? No  Name of Therapist/Psychiatrist:    Have You Been Recently Discharged From Any Office Practice or Programs? No  Explanation of Discharge From Practice/Program: No data recorded  CCA Screening Triage Referral Assessment Type of Contact: Face-to-Face  Telemedicine Service Delivery:   Is this Initial or Reassessment?   Date Telepsych consult ordered in [facility]:    Time Telepsych consult ordered in [facility]:    Location of Assessment: [facility] ED  Provider Location: [facility] ED  Collateral Involvement: No data recorded  Does Patient Have a Court Appointed Legal Guardian? No  Legal Guardian Contact Information: No data recorded Copy of Legal Guardianship Form: No data recorded Legal Guardian Notified of Arrival: No data recorded Legal Guardian Notified of Pending Discharge: No data recorded If Minor and Not Living with Parent(s), Who has Custody? No data recorded Is CPS involved or ever been involved? Never  Is APS involved or ever been involved? Never  Patient Determined To Be At Risk for Harm To Self or Others Based on Review of Patient Reported Information or  Presenting Complaint? Yes, for Self-Harm  Method: No data recorded Availability of Means: No data recorded Intent: No data recorded Notification Required: No data recorded Additional Information for Danger to Others Potential: No data recorded Additional Comments for Danger to Others Potential: No data recorded Are There Guns or Other Weapons in Your Home? No data recorded Types of Guns/Weapons: No data recorded Are These Weapons Safely Secured?                             No data recorded Who Could Verify You Are Able To Have These Secured: No data recorded Do You Have any Outstanding Charges, Pending Court Dates, Parole/Probation? No data recorded Contacted To Inform of Risk of Harm To Self or Others: No data recorded  Does Patient Present under Involuntary Commitment? Yes  [place] of Residence: [place]  Patient Currently Receiving the Following Services: Not Receiving Services  Determination of Need: Emergent (2 hours)  Options For Referral: Inpatient Hospitalization  CCA Biopsychosocial Patient Reported Schizophrenia/Schizoaffective Diagnosis in Past: No  Strengths: Have some insight, seeking help voluntarily and have stable housing.  Mental Health Symptoms Depression:   Change in energy/activity; Hopelessness; Fatigue; Increase/decrease in appetite; Sleep (too much or little); Worthlessness   Duration of Depressive symptoms:  Duration of Depressive Symptoms: Greater than two weeks   Mania:   Recklessness; Racing thoughts; Change in energy/activity   Anxiety:    Difficulty concentrating; Restlessness; Sleep; Tension; Worrying   Psychosis:   None   Duration of Psychotic symptoms:    Trauma:   N/A   Obsessions:   N/A   Compulsions:   N/A   Inattention:   N/A   Hyperactivity/Impulsivity:   N/A   Oppositional/Defiant Behaviors:   N/A   Emotional Irregularity:   N/A   Other Mood/Personality Symptoms:  No data recorded   Mental Status Exam Appearance and self-care  Stature:   Average   Weight:   Average weight   Clothing:   Neat/clean; Age-appropriate   Grooming:   Normal   Cosmetic use:   None   Posture/gait:   Normal   Motor activity:   -- (Within normal range)   Sensorium  Attention:   Normal   Concentration:   Normal   Orientation:   X5   Recall/memory:   Normal   Affect and Mood  Affect:   Appropriate; Full Range   Mood:   Depressed; Anxious   Relating  Eye contact:    Normal   Facial expression:   Anxious; Depressed; Responsive   Attitude toward examiner:   Cooperative   Thought and Language  Speech flow:  Clear and Coherent   Thought content:   Appropriate to Mood and Circumstances   Preoccupation:   None   Hallucinations:   None   Organization:   Coherent; Intact   [employer] of Knowledge:   Fair   Intelligence:   Average   Abstraction:   Functional   Judgement:   Fair   Dance movement psychotherapist:   Adequate   Insight:   Fair   Decision Making:   Impulsive   Social Functioning  Social Maturity:   Isolates   Social Judgement:   "Chief of Staff"; Normal   Stress  Stressors:   Other (Comment)   Coping Ability:   Overwhelmed   Skill Deficits:   None   Supports:   Family     Religion: Religion/Spirituality Are You A Religious Person?:  No  Leisure/Recreation: Leisure / Recreation Do You Have Hobbies?: No  Exercise/Diet: Exercise/Diet Do You Exercise?: No Have You Gained or Lost A Significant Amount of Weight in the Past Six Months?: No Do You Follow a Special Diet?: No Do You Have Any Trouble Sleeping?: No   CCA Employment/Education Employment/Work Situation: Employment / Work Situation Employment Situation: Employed Patient's Job has Been Impacted by Current Illness: No Has Patient ever Been in Equities trader?: No  Education: Education Is Patient Currently Attending School?: No Did You Have An Individualized Education Program (IIEP): No Did You Have Any Difficulty At [employer]?: No Patient's Education Has Been Impacted by Current Illness: No   CCA Family/Childhood History Family and Relationship History: Family history Marital status: Married Does patient have children?: Yes How is patient's relationship with their children?: States they have a good relationship  Childhood History:  Childhood History By whom was/is the patient raised?: Mother Did patient suffer any  verbal/emotional/physical/sexual abuse as a child?: No Did patient suffer from severe childhood neglect?: No Has patient ever been sexually abused/assaulted/raped as an adolescent or adult?: No Was the patient ever a victim of a crime or a disaster?: No Witnessed domestic violence?: No Has patient been affected by domestic violence as an adult?: No  CCA Substance Use Alcohol/Drug Use: Alcohol / Drug Use Pain Medications: See PTA Prescriptions: See PTA Over the Counter: See PTA History of alcohol / drug use?: Yes Longest period of sobriety (when/how long): Alcohol and Cocaine Substance #1 Name of Substance 1: Alcohol Substance #2 Name of Substance 2: Cocaine  ASAM's:  Six Dimensions of Multidimensional Assessment  Dimension 1:  Acute Intoxication and/or Withdrawal Potential:      Dimension 2:  Biomedical Conditions and Complications:      Dimension 3:  Emotional, Behavioral, or Cognitive Conditions and Complications:     Dimension 4:  Readiness to Change:     Dimension 5:  Relapse, Continued use, or Continued Problem Potential:     Dimension 6:  Recovery/Living Environment:     ASAM Severity Score:    ASAM Recommended Level of Treatment:     Substance use Disorder (SUD)   Recommendations for Services/Supports/Treatments:   Discharge Disposition:   DSM5 Diagnoses: Patient Active Problem List   Diagnosis Date Noted   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   OSA (obstructive sleep apnea) 10/30/2020   Tobacco use disorder 09/29/2018   Cocaine use disorder, moderate, dependence (HCC) 09/28/2018   Suicidal ideation 09/28/2018   Bipolar I disorder, most recent episode depressed, severe without psychotic features (HCC) 09/28/2018   Referrals to Alternative Service(s): Referred to Alternative Service(s):   Place:   Date:   Time:    Referred to Alternative Service(s):   Place:   Date:   Time:    Referred to Alternative Service(s):   Place:   Date:   Time:    Referred to  Alternative Service(s):   Place:   Date:   Time:     Lilyan Gilford MS, LCAS, [facility], [facility] Therapeutic Triage Specialist 09/04/2023 3:00 PM

## 2023-09-04 NOTE — ED Notes (Signed)
IVC 

## 2023-09-04 NOTE — ED Notes (Signed)
IVC pending consult at this time

## 2023-09-04 NOTE — ED Provider Notes (Signed)
[facility] Provider Note    Event Date/Time   First MD Initiated Contact with Patient 09/04/23 1017     (approximate)   History   Psychiatric Evaluation   HPI  Nicholas Horne is a 52 y.o. male   with a history of bipolar disorder, sleep apnea  Patient reports he was supposed to have a routine CT scan today, but instead presents to the ER reports that he has just been feeling a bit suicidal.  He does not have a plan to harm himself, and he comes voluntarily but he reports that from time to time he will get these feelings like severe depression or suicidal ideation.  He uses alcohol from time to time to self medicate, but advises he does not drink daily and has not had any alcohol today and does not have withdrawals  He also reports that he "slipped up" a day or 2 ago and used cocaine.  Denies he wants to harm anyone.  He is having thoughts about suicide but denies any plan.      Physical Exam   Triage Vital Signs: ED Triage Vitals  Encounter Vitals Group     BP 09/04/23 0926 (!) 128/92     Systolic BP Percentile --      Diastolic BP Percentile --      Pulse Rate 09/04/23 0926 88     Resp 09/04/23 0926 14     Temp 09/04/23 0926 98.2 F (36.8 C)     Temp Source 09/04/23 0926 Oral     SpO2 09/04/23 0926 98 %     Weight 09/04/23 0923 190 lb (86.2 kg)     Height 09/04/23 0923 5\' 10"  (1.778 m)     Head Circumference --      Peak Flow --      Pain Score 09/04/23 0923 0     Pain Loc --      Pain Education --      Exclude from Growth Chart --     Most recent vital signs: Vitals:   09/04/23 0926  BP: (!) 128/92  Pulse: 88  Resp: 14  Temp: 98.2 F (36.8 C)  SpO2: 98%     General: Awake, no distress.  CV:  Good peripheral perfusion.  Normal tones and rate Resp:  Normal effort.  Clear bilateral Abd:  No distention.  Other:   Denies any recent illness.  No chest pain no trouble breathing.  ED Results / Procedures / Treatments    Labs (all labs ordered are listed, but only abnormal results are displayed) Labs Reviewed  COMPREHENSIVE METABOLIC PANEL - Abnormal; Notable for the following components:      Result Value   Potassium 3.4 (*)    Calcium 8.7 (*)    Total Bilirubin 1.5 (*)    All other components within normal limits  SALICYLATE LEVEL - Abnormal; Notable for the following components:   Salicylate Lvl <7.0 (*)    All other components within normal limits  ACETAMINOPHEN LEVEL - Abnormal; Notable for the following components:   Acetaminophen (Tylenol), Serum <10 (*)    All other components within normal limits  CBC - Abnormal; Notable for the following components:   RDW 16.0 (*)    All other components within normal limits  URINE DRUG SCREEN, QUALITATIVE ([facility] ONLY) - Abnormal; Notable for the following components:   Amphetamines, Ur Screen POSITIVE (*)    Cocaine Metabolite,Ur [place] POSITIVE (*)    All other  components within normal limits  ETHANOL   Labs interpreted as negative for acute finding other than mild hypokalemia  PROCEDURES:  Critical Care performed: No  Procedures   MEDICATIONS ORDERED IN ED: Medications  aspirin EC tablet 81 mg (has no administration in time range)  potassium chloride SA (KLOR-CON M) CR tablet 40 mEq (40 mEq Oral Given 09/04/23 1123)     IMPRESSION / MDM / ASSESSMENT AND PLAN / ED COURSE  I reviewed the triage vital signs and the nursing notes.                              Differential diagnosis includes, but is not limited to, depression, substance abuse, alcohol abuse, suicidal ideations etc.  Does have a history of underlying psychiatric disease.  He is currently voluntary, and as long as he remains here and motivated to see our psychiatrist I do not believe he needs to be under IVC.  If he is to attempt to flee or elope, at that point I would place him under IVC but at this point he comes voluntarily and voices no clear plan to harm himself or others.  He  does not appear to be acutely psychotic at this time.    Patient's presentation is most consistent with acute presentation with potential threat to life or bodily function.   ----------------------------------------- 1:55 PM on 09/04/2023 ----------------------------------------- Patient medically cleared for psychiatric evaluation.  I was notified that we do not currently have psychiatry coverage until this evening. Out of abundance of caution given the patient will likely have be having to wait in the ER for some extended period of time, and the fact that he clearly represents suicidal ideations I have placed him under involuntary commitment pending evaluation by our psychiatrist.  Labs demonstrate mild hypokalemia.  No evidence of acute abnormality otherwise noted normal CBC   Positive cocaine amphetamine  FINAL CLINICAL IMPRESSION(S) / ED DIAGNOSES   Final diagnoses:  Depression, unspecified depression type  Suicidal ideation     Rx / DC Orders   ED Discharge Orders     None        Note:  This document was prepared using Dragon voice recognition software and may include unintentional dictation errors.   Sharyn Creamer, MD 09/04/23 1356

## 2023-09-04 NOTE — ED Notes (Signed)
Pt dressed out:  [employer] shoes [employer] - unbroken [employer] boxers [employer]

## 2023-09-04 NOTE — ED Provider Notes (Signed)
The patient has been placed in psychiatric observation due to the need to provide a safe environment for the patient while obtaining psychiatric consultation and evaluation, as well as ongoing medical and medication management to treat the patient's condition.  The patient has been placed under full IVC at this time.    Sharyn Creamer, MD 09/04/23 1350

## 2023-09-04 NOTE — ED Triage Notes (Signed)
Pt to ed from home via POV for suicidal ideations. Pt states " I have a scheduled cardiologist apt today and I need yall to call them". Pt states "I am feeling suicidal everyday. I try to self medicate with lots of ETOH and did some cocaine.". Pt is CAOx4, in no acute distress and ambulatory in triage.

## 2023-09-05 ENCOUNTER — Inpatient Hospital Stay
Admission: AD | Admit: 2023-09-05 | Discharge: 2023-09-09 | DRG: 885 | Disposition: A | Discharge status: Home / Self Care | Payer: Commercial Managed Care - PPO | Source: Intra-hospital | Attending: Psychiatry | Admitting: Psychiatry

## 2023-09-05 DIAGNOSIS — Z833 Family history of diabetes mellitus: Secondary | ICD-10-CM

## 2023-09-05 DIAGNOSIS — Z8249 Family history of ischemic heart disease and other diseases of the circulatory system: Secondary | ICD-10-CM

## 2023-09-05 DIAGNOSIS — F411 Generalized anxiety disorder: Secondary | ICD-10-CM | POA: Diagnosis present

## 2023-09-05 DIAGNOSIS — R45851 Suicidal ideations: Secondary | ICD-10-CM | POA: Diagnosis present

## 2023-09-05 DIAGNOSIS — J4520 Mild intermittent asthma, uncomplicated: Secondary | ICD-10-CM | POA: Diagnosis present

## 2023-09-05 DIAGNOSIS — F3163 Bipolar disorder, current episode mixed, severe, without psychotic features: Secondary | ICD-10-CM

## 2023-09-05 DIAGNOSIS — F319 Bipolar disorder, unspecified: Principal | ICD-10-CM | POA: Diagnosis present

## 2023-09-05 DIAGNOSIS — Z560 Unemployment, unspecified: Secondary | ICD-10-CM | POA: Diagnosis not present

## 2023-09-05 DIAGNOSIS — F1010 Alcohol abuse, uncomplicated: Secondary | ICD-10-CM | POA: Diagnosis present

## 2023-09-05 DIAGNOSIS — F17210 Nicotine dependence, cigarettes, uncomplicated: Secondary | ICD-10-CM | POA: Diagnosis present

## 2023-09-05 DIAGNOSIS — F1490 Cocaine use, unspecified, uncomplicated: Secondary | ICD-10-CM | POA: Diagnosis present

## 2023-09-05 DIAGNOSIS — G4733 Obstructive sleep apnea (adult) (pediatric): Secondary | ICD-10-CM | POA: Diagnosis present

## 2023-09-05 DIAGNOSIS — K219 Gastro-esophageal reflux disease without esophagitis: Secondary | ICD-10-CM | POA: Diagnosis present

## 2023-09-05 LAB — GLUCOSE, CAPILLARY: Glucose-Capillary: 89 mg/dL (ref 70–99)

## 2023-09-05 MED ORDER — METOPROLOL TARTRATE 25 MG PO TABS
50.0000 mg | ORAL_TABLET | Freq: Once | ORAL | Status: AC
  Administered 2023-09-05: 50 mg via ORAL
  Filled 2023-09-05: qty 2

## 2023-09-05 MED ORDER — HALOPERIDOL 5 MG PO TABS: 5.0000 mg | ORAL_TABLET | Freq: Three times a day (TID) | ORAL | Status: DC | PRN

## 2023-09-05 MED ORDER — TRAZODONE HCL 50 MG PO TABS
50.0000 mg | ORAL_TABLET | Freq: Every evening | ORAL | Status: DC | PRN
  Filled 2023-09-05 (×2): qty 1

## 2023-09-05 MED ORDER — HALOPERIDOL LACTATE 5 MG/ML IJ SOLN: 5.0000 mg | Freq: Three times a day (TID) | INTRAMUSCULAR | Status: DC | PRN

## 2023-09-05 MED ORDER — ALUM & MAG HYDROXIDE-SIMETH 200-200-20 MG/5ML PO SUSP: 30.0000 mL | ORAL | Status: DC | PRN

## 2023-09-05 MED ORDER — MAGNESIUM HYDROXIDE 400 MG/5ML PO SUSP: 30.0000 mL | Freq: Every day | ORAL | Status: DC | PRN

## 2023-09-05 MED ORDER — ASPIRIN 81 MG PO TBEC
81.0000 mg | DELAYED_RELEASE_TABLET | Freq: Every day | ORAL | Status: DC
  Administered 2023-09-06 – 2023-09-09 (×3): 81 mg via ORAL
  Filled 2023-09-05 (×3): qty 1

## 2023-09-05 MED ORDER — ACETAMINOPHEN 325 MG PO TABS: 650.0000 mg | ORAL_TABLET | Freq: Four times a day (QID) | ORAL | Status: DC | PRN

## 2023-09-05 MED ORDER — DIPHENHYDRAMINE HCL 25 MG PO CAPS: 50.0000 mg | ORAL_CAPSULE | Freq: Three times a day (TID) | ORAL | Status: DC | PRN

## 2023-09-05 MED ORDER — LORAZEPAM 2 MG PO TABS: 2.0000 mg | ORAL_TABLET | Freq: Three times a day (TID) | ORAL | Status: DC | PRN

## 2023-09-05 MED ORDER — DIPHENHYDRAMINE HCL 50 MG/ML IJ SOLN: 50.0000 mg | Freq: Three times a day (TID) | INTRAMUSCULAR | Status: DC | PRN

## 2023-09-05 MED ORDER — LORAZEPAM 2 MG/ML IJ SOLN: 2.0000 mg | Freq: Three times a day (TID) | INTRAMUSCULAR | Status: DC | PRN

## 2023-09-05 NOTE — ED Notes (Signed)
IVC/Recommend psychiatric Inpatient admission when medically cleared.  

## 2023-09-05 NOTE — Group Note (Signed)
Date:  09/05/2023 Time:  9:11 PM  Group Topic/Focus:  Recovery Goals:   The focus of this group is to identify appropriate goals for recovery and establish a plan to achieve them.    Participation Level:  Active  Participation Quality:  Appropriate  Affect:  Appropriate  Cognitive:  Appropriate  Insight: Appropriate  Engagement in Group:  Engaged  Modes of Intervention:  Discussion   [name] 09/05/2023, 9:11 PM

## 2023-09-05 NOTE — ED Notes (Signed)
Pt given supper tray and drink 

## 2023-09-05 NOTE — Consult Note (Signed)
Telepsych Consultation   Reason for Consult:  Suicidal ideation, depression, anxiety, and substance abuse. Referring Physician:  Sharyn Creamer Location of Patient: [facility] Location of Provider: Other: [facility]  Patient Identification: Nicholas Horne MRN:  [id number] Principal Diagnosis: Bipolar disorder, most recent or current episode depressed.  Diagnosis:  Active Problems:   Passive suicidal ideations   Anxiety state   Bipolar disorder, current episode mixed, severe, without psychotic features (HCC)   Total Time spent with patient: 20 minutes  Subjective:   Nicholas Horne is a 52 y.o. male patient admitted with psychiatric history of mixed Bipolar disorder, suicidal ideations, anxiety, polysubstance abuse, and alcohol abuse, who presented voluntarily to [facility] due to daily suicidal ideations. Patient has since being IVC'd by EDP  HPI:  On evaluation, patient is alert, oriented x 4, and cooperative. Speech is clear and coherent. Pt appears dressed in hospital scrubs. Eye contact is fair. Mood is anxious and depressed, affect is congruent with mood. Thought process is coherent and thought content is WDL. Pt endorses passive SI, no plan, and paranoia. He denies HI/AVH. There is no objective indication that the patient is responding to internal stimuli. No delusions elicited during this assessment.   Patient reports " I was kinda feeling overwhelmed, and I started shutting down, I'm isolating, don't wanna do anything, I'm having issues, been having suicidal thoughts daily, with increased anxiety now, worse than it used to be, I don't want to go down the rabbit hole I've been before, I've dealt with a lot of suicide thoughts most of my life when I was abusing substances, it never stops, sometimes its overwhelming and I feel like it's coming back this time, that's why I'm here to get some help".  Patient reports "there are days when I'm geared up and then there are days when I feel really low, with  poor sleep, poor appetite and I'm tired all the time".   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   Patient reports "I have more anxiety now and paranoia at my job, and always I'm thinking they gonna fire me for something, and I'm always tired because I've been working a lot for 6 months now".   Patient endorses illicit substance use and reports "I've been drinking a little bit and then I did some cocaine".  Patient denies regular use of these substances.  Patient is not established with an outpatient psychiatric provider and is not taking any psychiatric medications at this time.  Patient reports multiple history of inpatient psychiatric hospitalizations at various sites including; [address], [place] x 2, and [place].  Patient endorses psychiatric history of bipolar disorder and has trialed medications in the past which he is allergic to due to the side effects such as Abilify-restless legs/insomnia, Seroquel-blurry vision, and Lithium-stiff joints.  Support, encouragement, reassurance provided about ongoing stressors.  .  Discussed recommendation for inpatient psychiatric admission for stabilization and treatment.  Discussed inpatient milieu and expectations.  Patient is provided with opportunity for questions.  Patient verbalizes his understanding and is in agreement.  Past Psychiatric History: See Chart  Risk to Self:  Y Risk to Others:  N Prior Inpatient Therapy:  Y Prior Outpatient Therapy:  N  Past Medical History:  Past Medical History:  Diagnosis Date   Bipolar disorder (  manic depression) (HCC)    Cervical radiculopathy    GERD (gastroesophageal reflux disease)    Hypoglycemia    Mild intermittent reactive airway disease    OSA (obstructive sleep  apnea)     Past Surgical History:  Procedure Laterality Date   CERVICAL DISC ARTHROPLASTY N/A 11/02/2020   Procedure: Total disc replacement C5-7;  Surgeon: Venita Lick, MD;  Location: [facility] OR;  Service: Orthopedics;  Laterality: N/A;  3 hrs   INCISION AND DRAINAGE OF WOUND Left 05/19/2021   Procedure: IRRIGATION AND DEBRIDEMENT WOUND;  Surgeon: Knute Neu, MD;  Location: [facility] OR;  Service: Plastics;  Laterality: Left;   NASAL FRACTURE SURGERY  1993   TONSILLECTOMY     Family History:  Family History  Problem Relation Age of Onset   Healthy Mother    Diabetes Father    Heart attack Maternal Grandfather    Family Psychiatric  History: N/A Social History:  Social History   Substance and Sexual Activity  Alcohol Use No     Social History   Substance and Sexual Activity  Drug Use Not Currently   Types: Cocaine   Comment: clean 5 yrs    Social History   Socioeconomic History   Marital status: Divorced    Spouse name: Not on file   Number of children: Not on file   Years of education: Not on file   Highest education level: Not on file  Occupational History   Not on file  Tobacco Use   Smoking status: Some Days    Types: Cigarettes   Smokeless tobacco: Never  Vaping Use   Vaping status: Never Used  Substance and Sexual Activity   Alcohol use: No   Drug use: Not Currently    Types: Cocaine    Comment: clean 5 yrs   Sexual activity: Not on file  Other Topics Concern   Not on file  Social History Narrative   Not on file   Social Determinants of Health   Financial Resource Strain: Not on file  Food Insecurity: Not on file  Transportation Needs: Not on file  Physical Activity: Not on file  Stress: Not on file  Social Connections: Not on file   Additional Social History:    Allergies:   Allergies  Allergen Reactions   Abilify [Aripiprazole] Other (See Comments)    Suicidal thoughts   Gabapentin     Felt funny    Labs:  Results for orders placed or  performed during the hospital encounter of 09/04/23 (from the past 48 hour(s))  Urine Drug Screen, Qualitative     Status: Abnormal   Collection Time: 09/04/23  9:25 AM  Result Value Ref Range   Tricyclic, Ur Screen NONE DETECTED NONE DETECTED   Amphetamines, Ur Screen POSITIVE (A) NONE DETECTED   MDMA (Ecstasy)Ur Screen NONE DETECTED NONE DETECTED   Cocaine Metabolite,Ur [place] POSITIVE (A) NONE DETECTED   Opiate, Ur Screen NONE DETECTED NONE DETECTED   Phencyclidine (PCP) Ur S NONE DETECTED NONE DETECTED   Cannabinoid 50 Ng, Ur [place] NONE DETECTED NONE DETECTED   Barbiturates, Ur Screen NONE DETECTED NONE DETECTED   Benzodiazepine, Ur Scrn NONE DETECTED NONE DETECTED   Methadone Scn, Ur NONE DETECTED NONE DETECTED    Comment: (NOTE) Tricyclics + metabolites, urine    Cutoff 1000 ng/mL Amphetamines + metabolites, urine  Cutoff 1000 ng/mL MDMA (Ecstasy), urine              Cutoff 500 ng/mL Cocaine Metabolite, urine  Cutoff 300 ng/mL Opiate + metabolites, urine        Cutoff 300 ng/mL Phencyclidine (PCP), urine         Cutoff 25 ng/mL Cannabinoid, urine                 Cutoff 50 ng/mL Barbiturates + metabolites, urine  Cutoff 200 ng/mL Benzodiazepine, urine              Cutoff 200 ng/mL Methadone, urine                   Cutoff 300 ng/mL  The urine drug screen provides only a preliminary, unconfirmed analytical test result and should not be used for non-medical purposes. Clinical consideration and professional judgment should be applied to any positive drug screen result due to possible interfering substances. A more specific alternate chemical method must be used in order to obtain a confirmed analytical result. Gas chromatography / mass spectrometry (GC/MS) is the preferred confirm atory method. Performed at [facility], [address] [place], [place] [address]   Comprehensive metabolic panel     Status: Abnormal   Collection Time: 09/04/23  9:27 AM  Result Value  Ref Range   Sodium 135 135 - 145 mmol/L   Potassium 3.4 (L) 3.5 - 5.1 mmol/L   Chloride 100 98 - 111 mmol/L   CO2 24 22 - 32 mmol/L   Glucose, Bld 80 70 - 99 mg/dL    Comment: Glucose reference range applies only to samples taken after fasting for at least 8 hours.   BUN 16 6 - 20 mg/dL   Creatinine, Ser 6.21 0.61 - 1.24 mg/dL   Calcium 8.7 (L) 8.9 - 10.3 mg/dL   Total Protein 7.1 6.5 - 8.1 g/dL   Albumin 3.7 3.5 - 5.0 g/dL   AST 37 15 - 41 U/L   ALT 36 0 - 44 U/L   Alkaline Phosphatase 45 38 - 126 U/L   Total Bilirubin 1.5 (H) 0.3 - 1.2 mg/dL   GFR, Estimated >30 >86 mL/min    Comment: (NOTE) Calculated using the CKD-EPI Creatinine Equation (2021)    Anion gap 11 5 - 15    Comment: Performed at [facility], [address] [place], [place] [address]  Ethanol     Status: None   Collection Time: 09/04/23  9:27 AM  Result Value Ref Range   Alcohol, Ethyl (B) <10 <10 mg/dL    Comment: (NOTE) Lowest detectable limit for serum alcohol is 10 mg/dL.  For medical purposes only. Performed at [facility], [address] [place], [place] [address]   Salicylate level     Status: Abnormal   Collection Time: 09/04/23  9:27 AM  Result Value Ref Range   Salicylate Lvl <7.0 (L) 7.0 - 30.0 mg/dL    Comment: Performed at [facility], [address] [place], [place] [address]  Acetaminophen level     Status: Abnormal   Collection Time: 09/04/23  9:27 AM  Result Value Ref Range   Acetaminophen (Tylenol), Serum <10 (L) 10 - 30 ug/mL    Comment: (NOTE) Therapeutic concentrations vary significantly. A range of 10-30 ug/mL  may be an effective concentration for many patients. However, some  are best treated at concentrations outside of this range. Acetaminophen concentrations >150 ug/mL at 4 hours after ingestion  and >50 ug/mL at 12 hours after ingestion are often associated with  toxic reactions.  Performed at [facility], [address]  [place], [place] [address]   cbc     Status: Abnormal   Collection Time: 09/04/23  9:27 AM  Result Value Ref Range   WBC 10.4 4.0 - 10.5 K/uL   RBC 5.07 4.22 - 5.81 MIL/uL   Hemoglobin 15.3 13.0 - 17.0 g/dL   HCT 21.3 08.6 - 57.8 %   MCV 91.3 80.0 - 100.0 fL   MCH 30.2 26.0 - 34.0 pg   MCHC 33.0 30.0 - 36.0 g/dL   RDW 46.9 (H) 62.9 - 52.8 %   Platelets 256 150 - 400 K/uL   nRBC 0.0 0.0 - 0.2 %    Comment: Performed at [facility], [address], [place], [place] [address]    Medications:  Current Facility-Administered Medications  Medication Dose Route Frequency Provider Last Rate Last Admin   aspirin EC tablet 81 mg  81 mg Oral Daily Sharyn Creamer, MD   81 mg at 09/04/23 1521   Current Outpatient Medications  Medication Sig Dispense Refill   metoprolol tartrate (LOPRESSOR) 50 MG tablet Take 1 tablet (50 mg total) by mouth once for 1 dose. ONE TO TWO HOURS PRIOR TO CARDIAC CTA 1 tablet 0   TESTOSTERONE CYPIONATE IM Inject 250 mg into the muscle once a week. At pt preference (Patient not taking: Reported on 09/04/2023)      Musculoskeletal: Strength & Muscle Tone: within normal limits Gait & Station: normal Patient leans: N/A  Psychiatric Specialty Exam:  Presentation  General Appearance:  Other (comment) (in hospital scrubs)  Eye Contact: Fair  Speech: Clear and Coherent  Speech Volume: Normal  Handedness: Right   Mood and Affect  Mood: Anxious; Depressed; Dysphoric  Affect: Congruent; Flat   Thought Process  Thought Processes: Coherent  Descriptions of Associations:Intact  Orientation:Full (Time, Place and Person)  Thought Content:WDL; Logical  History of Schizophrenia/Schizoaffective disorder:No  Duration of Psychotic Symptoms:No data recorded Hallucinations:Hallucinations: None  Ideas of Reference:None  Suicidal Thoughts:Suicidal Thoughts: Yes, Passive SI Passive Intent and/or Plan: Without Plan  Homicidal Thoughts:Homicidal  Thoughts: No   Sensorium  Memory: Immediate Fair  Judgment: Poor  Insight: Present   Executive Functions  Concentration: Good  Attention Span: Good  Recall: Good  Fund of Knowledge: Good  Language: Good   Psychomotor Activity  Psychomotor Activity: Psychomotor Activity: Normal   Assets  Assets: Communication Skills; Desire for Improvement   Sleep  Sleep: Sleep: Poor    Physical Exam: Physical Exam Constitutional:      General: He is not in acute distress.    Appearance: He is not diaphoretic.  HENT:     Head: Normocephalic.     Right Ear: External ear normal.     Left Ear: External ear normal.     Nose: No congestion.  Eyes:     General:        Right eye: No discharge.        Left eye: No discharge.  Pulmonary:     Effort: No respiratory distress.  Chest:     Chest wall: No tenderness.  Neurological:     Mental Status: He is alert and oriented to person, place, and time.  Psychiatric:        Attention and Perception: Attention and perception normal.        Mood and Affect: Mood is anxious and depressed. Affect is flat.        Speech: Speech normal.        Behavior: Behavior is cooperative.  Thought Content: Thought content is not paranoid or delusional. Thought content includes suicidal ideation. Thought content does not include homicidal ideation. Thought content does not include homicidal or suicidal plan.        Cognition and Memory: Cognition and memory normal.    Review of Systems  Constitutional:  Negative for chills, diaphoresis and fever.  HENT:  Negative for congestion.   Eyes:  Negative for discharge.  Respiratory:  Negative for cough, shortness of breath and wheezing.   Cardiovascular:  Negative for chest pain and palpitations.  Gastrointestinal:  Negative for diarrhea, nausea and vomiting.  Neurological:  Negative for dizziness, seizures, loss of consciousness, weakness and headaches.  Psychiatric/Behavioral:   Positive for depression, substance abuse and suicidal ideas. The patient is nervous/anxious.    Blood pressure (!) 99/56, pulse 82, temperature 98.1 F (36.7 C), temperature source Oral, resp. rate 16, height 5\' 10"  (1.778 m), weight 86.2 kg, SpO2 96%. Body mass index is 27.26 kg/m.  Treatment Plan Summary: Daily contact with patient to assess and evaluate symptoms and progress in treatment, Medication management, and Plan inpatient psychiatric admission for stabilization and treatment.  Disposition: Recommend psychiatric Inpatient admission when medically cleared. Supportive therapy provided about ongoing stressors.  This service was provided via telemedicine using a 2-way, interactive audio and video technology.  Names of all persons participating in this telemedicine service and their role in this encounter. Name: Vevelyn Francois Role: Patient  Name: Denton Brick Role: NP  Name: Andee Poles Role: LCAS  Name:  Role:     Mancel Bale, NP 09/05/2023 2:50 AM

## 2023-09-05 NOTE — OR Nursing (Signed)
Patient was admitted to the unit skin assessment,vital signs, height and weight completed with [facility]IG[facility] RN. Patient was given a tour of the unit. Dinner order was made.

## 2023-09-05 NOTE — ED Notes (Signed)
TTS at bedside. 

## 2023-09-05 NOTE — Progress Notes (Signed)
Patient was admitted to the unit and no sooner than when he got into the search room, he stated that he felt as if his blood sugar was low. This Clinical research associate obtained a CBG on patient, per his request, and it was 89 upon admission. Patient asked to have an orange juice, and it was given to him by staff. Patient remains safe on the unit.

## 2023-09-05 NOTE — ED Notes (Signed)
Breakfast tray placed at bedside.  

## 2023-09-06 ENCOUNTER — Encounter: Payer: Self-pay | Admitting: Psychiatry

## 2023-09-06 ENCOUNTER — Other Ambulatory Visit: Payer: Self-pay

## 2023-09-06 DIAGNOSIS — F319 Bipolar disorder, unspecified: Secondary | ICD-10-CM

## 2023-09-06 MED ORDER — DIAZEPAM 5 MG PO TABS
5.0000 mg | ORAL_TABLET | Freq: Two times a day (BID) | ORAL | Status: DC
  Administered 2023-09-06: 5 mg via ORAL
  Filled 2023-09-06: qty 1

## 2023-09-06 MED ORDER — DOXEPIN HCL 25 MG PO CAPS
25.0000 mg | ORAL_CAPSULE | Freq: Every day | ORAL | Status: DC
  Administered 2023-09-07: 25 mg via ORAL
  Filled 2023-09-06 (×4): qty 1

## 2023-09-06 MED ORDER — RISPERIDONE 1 MG PO TABS
0.5000 mg | ORAL_TABLET | ORAL | Status: DC
  Administered 2023-09-06 (×2): 0.5 mg via ORAL
  Filled 2023-09-06 (×3): qty 1

## 2023-09-06 MED ORDER — HYDROXYZINE HCL 25 MG PO TABS: 25.0000 mg | ORAL_TABLET | Freq: Four times a day (QID) | ORAL | Status: DC | PRN

## 2023-09-06 NOTE — Progress Notes (Signed)
Patient was cooperative with treatment on the shift, he was compliant with medication. He denies SI, HI & AVH. He remains sad and depressed. He seemed to sleep well through out the night.

## 2023-09-06 NOTE — [facility] Suicide Risk Assessment (Signed)
[facility] INPATIENT:  Family/Significant Other Suicide Prevention Education  Suicide Prevention Education:  Patient Refusal for Family/Significant Other Suicide Prevention Education: The patient Nicholas Horne has refused to provide written consent for family/significant other to be provided Family/Significant Other Suicide Prevention Education during admission and/or prior to discharge.  Physician notified.   Because the patient declined the LCSWA contacted a family member, the LCSWA provided the patient with an pamphlet on Suicide Prevention Education.     Marshell Levan 09/06/2023, 3:33 PM

## 2023-09-06 NOTE — Progress Notes (Signed)
D- Patient alert and oriented x 4. Affect anxious/mood congruent. Denies SI/ HI/ AVH. Patient denies pain. Patient endorses depression and anxiety. He states that he is here today because he has had so much on him lately. States that he is "working 10 hours 7 days a week and has a 13 and 51 year old at home". He states that he relapsed  on cocaine but was able to pull himself back and reach out for help. He did not attend groups today. A- Scheduled medications administered to patient, per MD orders. Support and encouragement provided.  Routine safety checks conducted every 15 minutes without incident.  Patient informed to notify staff with problems or concerns and verbalizes understanding. R- No adverse drug reactions noted.  Patient compliant with medications and treatment plan. Patient receptive, calm, cooperative and interacts well with others on the unit.  Patient contracts for safety and  remains safe on the unit at this time.

## 2023-09-06 NOTE — Group Note (Signed)
Date:  09/06/2023 Time:  4:15 PM  Group Topic/Focus:  Primary and Secondary Emotions:   The focus of this group is to discuss the difference between primary and secondary emotions.    Participation Level:  Did Not Attend   Rosaura Carpenter 09/06/2023, 4:15 PM

## 2023-09-06 NOTE — Group Note (Signed)
Date:  09/06/2023 Time:  6:27 PM  Group Topic/Focus:  he focus of the group is to promote physical activity outside in the courtyard to encourage some fresh air and some exercise to assist in their mental health treatment.    Participation Level:  Did Not Attend   Nicholas Horne 09/06/2023, 6:27 PM

## 2023-09-06 NOTE — Plan of Care (Signed)
  Problem: Education: Goal: Knowledge of General Education information will improve Description: Including pain rating scale, medication(s)/side effects and non-pharmacologic comfort measures Outcome: Progressing   Problem: Health Behavior/Discharge Planning: Goal: Ability to manage health-related needs will improve Outcome: Progressing   Problem: Clinical Measurements: Goal: Ability to maintain clinical measurements within normal limits will improve Outcome: Progressing Goal: Will remain free from infection Outcome: Progressing Goal: Diagnostic test results will improve Outcome: Progressing Goal: Respiratory complications will improve Outcome: Progressing Goal: Cardiovascular complication will be avoided Outcome: Progressing   Problem: Activity: Goal: Risk for activity intolerance will decrease Outcome: Progressing   Problem: Nutrition: Goal: Adequate nutrition will be maintained Outcome: Progressing   Problem: Coping: Goal: Level of anxiety will decrease Outcome: Progressing   Problem: Elimination: Goal: Will not experience complications related to bowel motility Outcome: Progressing Goal: Will not experience complications related to urinary retention Outcome: Progressing   Problem: Pain Managment: Goal: General experience of comfort will improve Outcome: Progressing   Problem: Safety: Goal: Ability to remain free from injury will improve Outcome: Progressing   Problem: Skin Integrity: Goal: Risk for impaired skin integrity will decrease Outcome: Progressing   Problem: Education: Goal: Knowledge of [facility] Education information/materials will improve Outcome: Progressing Goal: Emotional status will improve Outcome: Progressing Goal: Mental status will improve Outcome: Progressing Goal: Verbalization of understanding the information provided will improve Outcome: Progressing   Problem: Activity: Goal: Interest or engagement in activities will  improve Outcome: Progressing Goal: Sleeping patterns will improve Outcome: Progressing   Problem: Coping: Goal: Ability to verbalize frustrations and anger appropriately will improve Outcome: Progressing Goal: Ability to demonstrate self-control will improve Outcome: Progressing   Problem: Health Behavior/Discharge Planning: Goal: Identification of resources available to assist in meeting health care needs will improve Outcome: Progressing Goal: Compliance with treatment plan for underlying cause of condition will improve Outcome: Progressing   Problem: Physical Regulation: Goal: Ability to maintain clinical measurements within normal limits will improve Outcome: Progressing   Problem: Safety: Goal: Periods of time without injury will increase Outcome: Progressing   

## 2023-09-06 NOTE — Group Note (Signed)
Date:  09/06/2023 Time:  3:56 PM  Group Topic/Focus:  Outdoor recreation structured activity. The focus of the group is to promote physical activity outside in the courtyard to encourage some fresh air and some exercise to assist in their mental health treatment.    Participation Level:  Did Not Attend   Nicholas Horne 09/06/2023, 3:56 PM

## 2023-09-06 NOTE — [facility] Suicide Risk Assessment (Signed)
[facility] Admission Suicide Risk Assessment   Nursing information obtained from:    Demographic factors:  Low socioeconomic status Current Mental Status:  Suicidal ideation indicated by patient Loss Factors:  Financial problems / change in socioeconomic status Historical Factors:  Family history of mental illness or substance abuse Risk Reduction Factors:  Responsible for children under 51 years of age, Employed, Living with another person, especially a relative  Total Time spent with patient: 1 hour Principal Problem: Bipolar 1 disorder, depressed (HCC) Diagnosis:  Principal Problem:   Bipolar 1 disorder, depressed (HCC)  Subjective Data:  Nicholas Horne is a 52 year old Latin American male who is involuntarily admitted to psychiatry for depression and suicidal ideation.  He has been having some heart palpitations and has been talking to his doctor who ordered some tests.  An EKG last month was normal.  He has trouble sleeping at night and has been self-medicating with alcohol.  He says that he is working too much.  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  He currently lives in [place] with his wife and 2 sons.  He has been hospitalized here before but I cannot find that admission but there is one from Dr. Dub Mikes at behavioral [facility] 10 years ago in [place].  He says that he does not tolerate Abilify or Seroquel.  Trazodone makes him too groggy.  He is currently not taking any medications for depression.  He endorses anhedonia, difficulty sleeping, mood swings, alcohol abuse and suicidal thoughts.   Continued Clinical Symptoms:  Alcohol Use Disorder Identification Test Final Score (AUDIT): 6 The "Alcohol Use Disorders Identification Test", Guidelines for Use in Primary Care, Second Edition.  World Science writer [facility]). Score between 0-7:  no or low risk or alcohol related problems. Score between 8-15:  moderate risk of alcohol related problems. Score between 16-19:  high risk of alcohol related  problems. Score 20 or above:  warrants further diagnostic evaluation for alcohol dependence and treatment.   CLINICAL FACTORS:   Severe Anxiety and/or Agitation Bipolar Disorder:   Depressive phase Depression:   Anhedonia   Musculoskeletal: Strength & Muscle Tone: within normal limits Gait & Station: normal Patient leans: N/A  Psychiatric Specialty Exam:  Presentation  General Appearance:  Other (comment) (in hospital scrubs)  Eye Contact: Fair  Speech: Clear and Coherent  Speech Volume: Normal  Handedness: Right   Mood and Affect  Mood: Anxious; Depressed; Dysphoric  Affect: Congruent; Flat   Thought Process  Thought Processes: Coherent  Descriptions of Associations:Intact  Orientation:Full (Time, Place and Person)  Thought Content:WDL; Logical  History of Schizophrenia/Schizoaffective disorder:No  Duration of Psychotic Symptoms:No data recorded Hallucinations:Hallucinations: None  Ideas of Reference:None  Suicidal Thoughts:Suicidal Thoughts: Yes, Passive SI Passive Intent and/or Plan: Without Plan  Homicidal Thoughts:Homicidal Thoughts: No   Sensorium  Memory: Immediate Fair  Judgment: Poor  Insight: Present   Executive Functions  Concentration: Good  Attention Span: Good  Recall: Good  Fund of Knowledge: Good  Language: Good   Psychomotor Activity  Psychomotor Activity: Psychomotor Activity: Normal   Assets  Assets: Communication Skills; Desire for Improvement   Sleep  Sleep: Sleep: Poor     Blood pressure 127/74, pulse 81, temperature 97.7 F (36.5 C), temperature source Oral, resp. rate 18, height (P) 5\' 11"  (1.803 m), weight (P) 85.3 kg, SpO2 100%. Body mass index is 26.22 kg/m (pended).   COGNITIVE FEATURES THAT CONTRIBUTE TO RISK:  Polarized thinking    SUICIDE RISK:   Mild:  Suicidal ideation  of limited frequency, intensity, duration, and specificity.  There are no identifiable plans, no  associated intent, mild dysphoria and related symptoms, good self-control (both objective and subjective assessment), few other risk factors, and identifiable protective factors, including available and accessible social support.  PLAN OF CARE: See orders  I certify that inpatient services furnished can reasonably be expected to improve the patient's condition.   Sarina Ill, DO 09/06/2023, 12:37 PM
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
## 2023-09-06 NOTE — H&P (Signed)
Psychiatric Admission Assessment Adult  Patient Identification: Nicholas Horne MRN:  [id number] Date of Evaluation:  09/06/2023 Chief Complaint:  Bipolar 1 disorder, depressed (HCC) [F31.9] Principal Diagnosis: Bipolar 1 disorder, depressed (HCC) Diagnosis:  Principal Problem:   Bipolar 1 disorder, depressed (HCC)  History of Present Illness: Mr. Nicholas Horne is a 52 year old Latin American male who is involuntarily admitted to psychiatry for depression and suicidal ideation.  He has been having some heart palpitations and has been talking to his doctor who ordered some tests.  An EKG last month was normal.  He has trouble sleeping at night and has been self-medicating with alcohol.  He says that he is working too much.  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  He currently lives in [place] with his wife and 2 sons.  He has been hospitalized here before but I cannot find that admission but there is one from Dr. Dub Mikes at behavioral [facility] 10 years ago in [place].  He says that he does not tolerate Abilify or Seroquel.  Trazodone makes him too groggy.  He is currently not taking any medications for depression.  He endorses anhedonia, difficulty sleeping, mood swings, alcohol abuse and suicidal thoughts.  Psychiatric evaluation in the emergency room is as follows: Nicholas Horne is a 52 y.o. male patient admitted with psychiatric history of mixed Bipolar disorder, suicidal ideations, anxiety, polysubstance abuse, and alcohol abuse, who presented voluntarily to [facility] due to daily suicidal ideations. Patient has since being IVC'd by EDP   HPI:  On evaluation, patient is alert, oriented x 4, and cooperative. Speech is clear and coherent. Pt appears dressed in hospital scrubs. Eye contact is fair. Mood is anxious and depressed, affect is congruent with mood. Thought process is coherent and thought content is WDL. Pt endorses passive SI, no plan, and paranoia. He denies HI/AVH. There is no objective  indication that the patient is responding to internal stimuli. No delusions elicited during this assessment.    Patient reports " I was kinda feeling overwhelmed, and I started shutting down, I'm isolating, don't wanna do anything, I'm having issues, been having suicidal thoughts daily, with increased anxiety now, worse than it used to be, I don't want to go down the rabbit hole I've been before, I've dealt with a lot of suicide thoughts most of my life when I was abusing substances, it never stops, sometimes its overwhelming and I feel like it's coming back this time, that's why I'm here to get some help".   Patient reports "there are days when I'm geared up and then there are days when I feel really low, with poor sleep, poor appetite and I'm tired all the time".    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    Patient reports "I have more anxiety now and paranoia at my job, and always I'm thinking they gonna fire me for something, and I'm always tired because I've been working a lot for 6 months now".    Patient endorses illicit substance use and reports "I've been drinking a little bit and then I did some cocaine".  Patient denies regular use of these substances.   Patient is not established with an outpatient psychiatric provider and is not taking any psychiatric medications at this time.   Patient reports  multiple history of inpatient psychiatric hospitalizations at various sites including; [address], [place] x 2, and [place].   Patient endorses psychiatric history of bipolar disorder and has trialed medications in the past which he is allergic to due to the side effects such as Abilify-restless legs/insomnia, Seroquel-blurry vision, and Lithium-stiff joints.    Support, encouragement, reassurance provided about ongoing stressors.  .   Discussed recommendation for inpatient psychiatric admission for stabilization and treatment.  Discussed inpatient milieu and expectations.  Patient is provided with opportunity for questions.  Patient verbalizes his understanding and is in agreement.  Associated Signs/Symptoms: Depression Symptoms:  depressed mood, anhedonia, insomnia, suicidal thoughts without plan, (Hypo) Manic Symptoms:  Distractibility, Impulsivity, Irritable Mood, Anxiety Symptoms:  Excessive Worry, Panic Symptoms, Psychotic Symptoms:  Paranoia, PTSD Symptoms: NA Total Time spent with patient: 1 hour  Past Psychiatric History: He has been psychiatrically hospitalized twice once [facility] in [place] by Dr. Dub Mikes and once here that I cannot find.  He does not like trazodone, Seroquel, Abilify, or Remeron.  They are all too sedating for him.  He says that he has been diagnosed with bipolar disorder.  Is the patient at risk to self? Yes.    Has the patient been a risk to self in the past 6 months? Yes.    Has the patient been a risk to self within the distant past? Yes.    Is the patient a risk to others? No.  Has the patient been a risk to others in the past 6 months? No.  Has the patient been a risk to others within the distant past? No.   [place] Scale:  Flowsheet Row Admission (Current) from 09/05/2023 in [facility] ED from 09/04/2023 in [facility] Emergency Department at [facility] ED from 06/09/2023 in [facility] Emergency Department at [facility]  C-SSRS RISK CATEGORY High Risk High Risk No Risk        Prior Inpatient Therapy: Yes.   If yes, describe as above Prior Outpatient Therapy: No. If yes, describe   Alcohol Screening: 1. How often do you have a drink containing alcohol?: 2 to 3 times a week 2. How many drinks containing alcohol do you have on a  typical day when you are drinking?: 3 or 4 3. How often do you have six or more drinks on one occasion?: Never AUDIT-C Score: 4 4. How often during the last year have you found that you were not able to stop drinking once you had started?: Never 5. How often during the last year have you failed to do what was normally expected from you because of drinking?: Never 6. How often during the last year have you needed a first drink in the morning to get yourself going after a heavy drinking session?: Never 7. How often during the last year have you had a feeling of guilt of remorse after drinking?: Never 8. How often during the last year have you been unable to remember what happened the night before because you had been drinking?: Never 9. Have you or someone else been injured as a result of your drinking?: No 10. Has a relative or friend or a doctor or another health worker been concerned about your drinking or suggested you cut down?: Yes, but not in the last year Alcohol Use Disorder Identification Test Final Score (AUDIT): 6 Alcohol Brief Interventions/Follow-up: Alcohol education/Brief advice Substance Abuse History in the last 12 months:  Yes.  Consequences of Substance Abuse: Withdrawal Symptoms:   Nausea Previous Psychotropic Medications: Yes  Psychological Evaluations: Yes  Past Medical History:  Past Medical History:  Diagnosis Date   Bipolar disorder (manic depression) (HCC)    Cervical radiculopathy    GERD (gastroesophageal reflux disease)    Hypoglycemia    Mild intermittent reactive airway disease    OSA (obstructive sleep apnea)     Past Surgical History:  Procedure Laterality Date   CERVICAL DISC ARTHROPLASTY N/A 11/02/2020   Procedure: Total disc replacement C5-7;  Surgeon: Venita Lick, MD;  Location: [facility] OR;  Service: Orthopedics;  Laterality: N/A;  3 hrs   INCISION AND DRAINAGE OF WOUND Left 05/19/2021   Procedure: IRRIGATION AND DEBRIDEMENT WOUND;  Surgeon: Knute Neu, MD;  Location: [facility] OR;  Service: Plastics;  Laterality: Left;   NASAL FRACTURE SURGERY  1993   TONSILLECTOMY     Family History:  Family History  Problem Relation Age of Onset   Healthy Mother    Diabetes Father    Heart attack Maternal Grandfather    Family Psychiatric  History: Unremarkable Tobacco Screening:  Social History   Tobacco Use  Smoking Status Some Days   Types: Cigarettes  Smokeless Tobacco Never    BH Tobacco Counseling     Are you interested in Tobacco Cessation Medications?  No value filed. Counseled patient on smoking cessation:  No value filed. Reason Tobacco Screening Not Completed: No value filed.       Social History:  Social History   Substance and Sexual Activity  Alcohol Use No     Social History   Substance and Sexual Activity  Drug Use Not Currently   Types: Cocaine   Comment: clean 5 yrs    Additional Social History:                           Allergies:   Allergies  Allergen Reactions   Lithium Other (See Comments)    Stiff joints   Seroquel [Quetiapine] Photosensitivity    Blurry Vision.   Abilify [Aripiprazole] Other (See Comments)    Suicidal thoughts, restless legs, insomnia.   Gabapentin     Felt funny   Lab Results:  Results for orders placed or performed during the hospital encounter of 09/05/23 (from the past 48 hour(s))  Glucose, capillary     Status: None   Collection Time: 09/05/23  6:42 PM  Result Value Ref Range   Glucose-Capillary 89 70 - 99 mg/dL    Comment: Glucose reference range applies only to samples taken after fasting for at least 8 hours.    Blood Alcohol level:  Lab Results  Component Value Date   ETH <10 09/04/2023   ETH <10 09/28/2018    Metabolic Disorder Labs:  Lab Results  Component Value Date   HGBA1C 5.3 09/28/2018   MPG 105.41 09/28/2018   No results found for: "PROLACTIN" Lab Results  Component Value Date   CHOL 179 08/15/2023   TRIG 180 (H) 08/15/2023    HDL 42 08/15/2023   CHOLHDL 4.3 08/15/2023   VLDL 13 09/28/2018   LDLCALC 106 (H) 08/15/2023   LDLCALC 137 (H) 09/28/2018    Current Medications: Current Facility-Administered Medications  Medication Dose Route Frequency Provider Last Rate Last Admin   acetaminophen (TYLENOL) tablet 650 mg  650 mg Oral Q6H PRN Marlou Sa, NP       alum & mag hydroxide-simeth (MAALOX/MYLANTA) 200-200-20 MG/5ML suspension  30 mL  30 mL Oral Q4H PRN Marlou Sa, NP       aspirin EC tablet 81 mg  81 mg Oral Daily Byungura, Veronique M, NP   81 mg at 09/06/23 0825   diphenhydrAMINE (BENADRYL) capsule 50 mg  50 mg Oral TID PRN Marlou Sa, NP       Or   diphenhydrAMINE (BENADRYL) injection 50 mg  50 mg Intramuscular TID PRN Marlou Sa, NP       haloperidol (HALDOL) tablet 5 mg  5 mg Oral TID PRN Marlou Sa, NP       Or   haloperidol lactate (HALDOL) injection 5 mg  5 mg Intramuscular TID PRN Rayburn Go, Veronique M, NP       LORazepam (ATIVAN) tablet 2 mg  2 mg Oral TID PRN Marlou Sa, NP       Or   LORazepam (ATIVAN) injection 2 mg  2 mg Intramuscular TID PRN Rayburn Go, Veronique M, NP       magnesium hydroxide (MILK OF MAGNESIA) suspension 30 mL  30 mL Oral Daily PRN Rayburn Go, Veronique M, NP       traZODone (DESYREL) tablet 50 mg  50 mg Oral QHS PRN Marlou Sa, NP       PTA Medications: Medications Prior to Admission  Medication Sig Dispense Refill Last Dose   metoprolol tartrate (LOPRESSOR) 50 MG tablet Take 1 tablet (50 mg total) by mouth once for 1 dose. ONE TO TWO HOURS PRIOR TO CARDIAC CTA 1 tablet 0    TESTOSTERONE CYPIONATE IM Inject 250 mg into the muscle once a week. At pt preference (Patient not taking: Reported on 09/04/2023)       Musculoskeletal: Strength & Muscle Tone: within normal limits Gait & Station: normal Patient leans: N/A            Psychiatric Specialty Exam:  Presentation  General Appearance:   Other (comment) (in hospital scrubs)  Eye Contact: Fair  Speech: Clear and Coherent  Speech Volume: Normal  Handedness: Right   Mood and Affect  Mood: Anxious; Depressed; Dysphoric  Affect: Congruent; Flat   Thought Process  Thought Processes: Coherent  Duration of Psychotic Symptoms:N/A Past Diagnosis of Schizophrenia or Psychoactive disorder: No  Descriptions of Associations:Intact  Orientation:Full (Time, Place and Person)  Thought Content:WDL; Logical  Hallucinations:Hallucinations: None  Ideas of Reference:None  Suicidal Thoughts:Suicidal Thoughts: Yes, Passive SI Passive Intent and/or Plan: Without Plan  Homicidal Thoughts:Homicidal Thoughts: No   Sensorium  Memory: Immediate Fair  Judgment: Poor  Insight: Present   Executive Functions  Concentration: Good  Attention Span: Good  Recall: Good  Fund of Knowledge: Good  Language: Good   Psychomotor Activity  Psychomotor Activity: Psychomotor Activity: Normal   Assets  Assets: Communication Skills; Desire for Improvement   Sleep  Sleep: Sleep: Poor    Physical Exam: Physical Exam Vitals and nursing note reviewed.  Constitutional:      Appearance: Normal appearance. He is normal weight.  HENT:     Head: Normocephalic and atraumatic.     Nose: Nose normal.     Mouth/Throat:     Pharynx: Oropharynx is clear.  Eyes:     Extraocular Movements: Extraocular movements intact.     Pupils: Pupils are equal, round, and reactive to light.  Cardiovascular:     Rate and Rhythm: Normal rate and regular rhythm.     Pulses: Normal pulses.     Heart sounds: Normal heart sounds.  Pulmonary:     Effort: Pulmonary effort is normal.     Breath sounds: Normal breath sounds.  Abdominal:     General: Abdomen is flat. Bowel sounds are normal.     Palpations: Abdomen is soft.  Musculoskeletal:        General: Normal range of motion.     Cervical back: Normal range of motion  and neck supple.  Skin:    General: Skin is warm and dry.  Neurological:     General: No focal deficit present.     Mental Status: He is alert and oriented to person, place, and time.  Psychiatric:        Attention and Perception: Attention and perception normal.        Mood and Affect: Mood is anxious and depressed. Affect is labile.        Speech: Speech normal.        Behavior: Behavior normal. Behavior is cooperative.        Thought Content: Thought content includes suicidal ideation.        Cognition and Memory: Cognition and memory normal.        Judgment: Judgment is impulsive and inappropriate.    Review of Systems  Constitutional: Negative.   HENT: Negative.    Eyes: Negative.   Respiratory: Negative.    Cardiovascular: Negative.   Gastrointestinal: Negative.   Genitourinary: Negative.   Musculoskeletal: Negative.   Skin: Negative.   Neurological: Negative.   Endo/Heme/Allergies: Negative.   Psychiatric/Behavioral:  Positive for depression and suicidal ideas. The patient has insomnia.    Blood pressure 127/74, pulse 81, temperature 97.7 F (36.5 C), temperature source Oral, resp. rate 18, height (P) 5\' 11"  (1.803 m), weight (P) 85.3 kg, SpO2 100%. Body mass index is 26.22 kg/m (pended).  Treatment Plan Summary: Daily contact with patient to assess and evaluate symptoms and progress in treatment start low-dose Risperdal.  Valium 5 mg every 12 hours.  Doxepin at bedtime.  Atarax as needed.  Consider Depakote.  Observation Level/Precautions:  15 minute checks  Laboratory:  CBC Chemistry Profile  Psychotherapy:    Medications:    Consultations:    Discharge Concerns:    Estimated LOS:  Other:     Physician Treatment Plan for Primary Diagnosis: Bipolar 1 disorder, depressed (HCC) Long Term Goal(s): Improvement in symptoms so as ready for discharge  Short Term Goals: Ability to identify changes in lifestyle to reduce recurrence of condition will improve, Ability  to verbalize feelings will improve, Ability to disclose and discuss suicidal ideas, Ability to demonstrate self-control will improve, Ability to identify and develop effective coping behaviors will improve, Ability to maintain clinical measurements within normal limits will improve, Compliance with prescribed medications will improve, and Ability to identify triggers associated with substance abuse/mental health issues will improve  Physician Treatment Plan for Secondary Diagnosis: Principal Problem:   Bipolar 1 disorder, depressed (HCC)  I certify that inpatient services furnished can reasonably be expected to improve the patient's condition.    Sarina Ill, DO 10/12/202412:22 PM

## 2023-09-07 DIAGNOSIS — F319 Bipolar disorder, unspecified: Secondary | ICD-10-CM | POA: Diagnosis not present

## 2023-09-07 MED ORDER — RISPERIDONE 1 MG PO TABS: 1.0000 mg | ORAL_TABLET | Freq: Every day | ORAL | Status: DC

## 2023-09-07 NOTE — Progress Notes (Signed)
[facility] MD Progress Note  09/07/2023 11:59 AM Nicholas Horne  MRN:  [id number] Subjective: Nicholas Horne is seen on rounds.  He says that his medications are making him sleepy in the daytime.  I did start him on Valium for detox just in case.  I will go ahead and stop that.  He has been drinking a lot but his vital signs are stable so we will discontinue the Valium and continue with the as needed Ativan.  The meantime will changes Risperdal to bedtime only.  He may interested in going to rehab. Principal Problem: Bipolar 1 disorder, depressed (HCC) Diagnosis: Principal Problem:   Bipolar 1 disorder, depressed (HCC)  Total Time spent with patient: 15 minutes  Past Psychiatric History: Bipolar disorder and alcohol abuse  Past Medical History:  Past Medical History:  Diagnosis Date   Bipolar disorder (manic depression) (HCC)    Cervical radiculopathy    GERD (gastroesophageal reflux disease)    Hypoglycemia    Mild intermittent reactive airway disease    OSA (obstructive sleep apnea)     Past Surgical History:  Procedure Laterality Date   CERVICAL DISC ARTHROPLASTY N/A 11/02/2020   Procedure: Total disc replacement C5-7;  Surgeon: Venita Lick, MD;  Location: [facility] OR;  Service: Orthopedics;  Laterality: N/A;  3 hrs   INCISION AND DRAINAGE OF WOUND Left 05/19/2021   Procedure: IRRIGATION AND DEBRIDEMENT WOUND;  Surgeon: Knute Neu, MD;  Location: [facility] OR;  Service: Plastics;  Laterality: Left;   NASAL FRACTURE SURGERY  1993   TONSILLECTOMY     Family History:  Family History  Problem Relation Age of Onset   Healthy Mother    Diabetes Father    Heart attack Maternal Grandfather    Family Psychiatric  History: Unremarkable Social History:  Social History   Substance and Sexual Activity  Alcohol Use No     Social History   Substance and Sexual Activity  Drug Use Not Currently   Types: Cocaine   Comment: clean 5 yrs    Social History   Socioeconomic History   Marital  status: Divorced    Spouse name: Not on file   Number of children: Not on file   Years of education: Not on file   Highest education level: Not on file  Occupational History   Not on file  Tobacco Use   Smoking status: Some Days    Types: Cigarettes   Smokeless tobacco: Never  Vaping Use   Vaping status: Never Used  Substance and Sexual Activity   Alcohol use: No   Drug use: Not Currently    Types: Cocaine    Comment: clean 5 yrs   Sexual activity: Not on file  Other Topics Concern   Not on file  Social History Narrative   Not on file   Social Determinants of Health   Financial Resource Strain: Not on file  Food Insecurity: No Food Insecurity (09/06/2023)   Hunger Vital Sign    Worried About Running Out of Food in the Last Year: Never true    Ran Out of Food in the Last Year: Never true  Transportation Needs: No Transportation Needs (09/06/2023)   PRAPARE - Administrator, Civil Service (Medical): No    Lack of Transportation (Non-Medical): No  Physical Activity: Not on file  Stress: Not on file  Social Connections: Not on file   Additional Social History:  Sleep: Good  Appetite:  Good  Current Medications: Current Facility-Administered Medications  Medication Dose Route Frequency Provider Last Rate Last Admin   acetaminophen (TYLENOL) tablet 650 mg  650 mg Oral Q6H PRN Marlou Sa, NP       alum & mag hydroxide-simeth (MAALOX/MYLANTA) 200-200-20 MG/5ML suspension 30 mL  30 mL Oral Q4H PRN Marlou Sa, NP       aspirin EC tablet 81 mg  81 mg Oral Daily Byungura, Veronique M, NP   81 mg at 09/07/23 0900   diphenhydrAMINE (BENADRYL) capsule 50 mg  50 mg Oral TID PRN Marlou Sa, NP       Or   diphenhydrAMINE (BENADRYL) injection 50 mg  50 mg Intramuscular TID PRN Marlou Sa, NP       doxepin (SINEQUAN) capsule 25 mg  25 mg Oral QHS Sarina Ill, DO   25 mg at 09/07/23  0010   haloperidol (HALDOL) tablet 5 mg  5 mg Oral TID PRN Marlou Sa, NP       Or   haloperidol lactate (HALDOL) injection 5 mg  5 mg Intramuscular TID PRN Marlou Sa, NP       hydrOXYzine (ATARAX) tablet 25 mg  25 mg Oral Q6H PRN Sarina Ill, DO       LORazepam (ATIVAN) tablet 2 mg  2 mg Oral TID PRN Marlou Sa, NP       Or   LORazepam (ATIVAN) injection 2 mg  2 mg Intramuscular TID PRN Rayburn Go, Veronique M, NP       magnesium hydroxide (MILK OF MAGNESIA) suspension 30 mL  30 mL Oral Daily PRN Marlou Sa, NP       [START ON 09/08/2023] risperiDONE (RISPERDAL) tablet 1 mg  1 mg Oral QHS Sarina Ill, DO        Lab Results:  Results for orders placed or performed during the hospital encounter of 09/05/23 (from the past 48 hour(s))  Glucose, capillary     Status: None   Collection Time: 09/05/23  6:42 PM  Result Value Ref Range   Glucose-Capillary 89 70 - 99 mg/dL    Comment: Glucose reference range applies only to samples taken after fasting for at least 8 hours.    Blood Alcohol level:  Lab Results  Component Value Date   ETH <10 09/04/2023   ETH <10 09/28/2018    Metabolic Disorder Labs: Lab Results  Component Value Date   HGBA1C 5.3 09/28/2018   MPG 105.41 09/28/2018   No results found for: "PROLACTIN" Lab Results  Component Value Date   CHOL 179 08/15/2023   TRIG 180 (H) 08/15/2023   HDL 42 08/15/2023   CHOLHDL 4.3 08/15/2023   VLDL 13 09/28/2018   LDLCALC 106 (H) 08/15/2023   LDLCALC 137 (H) 09/28/2018    Physical Findings: AIMS:  , ,  ,  ,    CIWA:    COWS:     Musculoskeletal: Strength & Muscle Tone: within normal limits Gait & Station: normal Patient leans: N/A  Psychiatric Specialty Exam:  Presentation  General Appearance:  Other (comment) (in hospital scrubs)  Eye Contact: Fair  Speech: Clear and Coherent  Speech Volume: Normal  Handedness: Right   Mood and Affect   Mood: Anxious; Depressed; Dysphoric  Affect: Congruent; Flat   Thought Process  Thought Processes: Coherent  Descriptions of Associations:Intact  Orientation:Full (Time, Place and Person)  Thought Content:WDL; Logical  History of Schizophrenia/Schizoaffective  disorder:No  Duration of Psychotic Symptoms:No data recorded Hallucinations:No data recorded Ideas of Reference:None  Suicidal Thoughts:No data recorded Homicidal Thoughts:No data recorded  Sensorium  Memory: Immediate Fair  Judgment: Poor  Insight: Present   Executive Functions  Concentration: Good  Attention Span: Good  Recall: Good  Fund of Knowledge: Good  Language: Good   Psychomotor Activity  Psychomotor Activity:No data recorded  Assets  Assets: Communication Skills; Desire for Improvement   Sleep  Sleep:No data recorded    Blood pressure 121/69, pulse (!) 57, temperature (!) 97.4 F (36.3 C), resp. rate 18, height 5\' 11"  (1.803 m), weight 85.3 kg, SpO2 100%. Body mass index is 26.22 kg/m.   Treatment Plan Summary: Daily contact with patient to assess and evaluate symptoms and progress in treatment, Medication management, and Plan discontinue Valium and change Risperdal to 1 mg at bedtime.  Continue doxepin 25 mg at bedtime.  Sarina Ill, DO 09/07/2023, 11:59 AM

## 2023-09-07 NOTE — Group Note (Signed)
Date:  09/07/2023 Time:  12:00 PM  Group Topic/Focus:  Goals Group:   The focus of this group is to help patients establish daily goals to achieve during treatment and discuss how the patient can incorporate goal setting into their daily lives to aide in recovery. Outdoor Recreation and Activity    Participation Level:  Did Not Attend  Lynelle Smoke [facility] 09/07/2023, 12:00 PM

## 2023-09-07 NOTE — Plan of Care (Signed)
.  D- Patient alert and oriented x4. Patient was pleasant but was restless and he express concern of medications that was making him tired. I educated him on the medications. Patient stayed in room for majority of the day due to the medication making him restless he stated. Denies SI, HI, AVH, and pain.  A- Scheduled medications administered to patient, per MD orders. Support and encouragement provided.  Routine safety checks conducted every 15 minutes.  Patient informed to notify staff with problems or concerns. R- No adverse drug reactions noted. Patient contracts for safety at this time. Patient compliant with medications and treatment plan. Patient receptive, calm, and cooperative. Patient interacts well with others on the unit.  Patient remains safe at this time.  Problem: Education: Goal: Knowledge of General Education information will improve Description: Including pain rating scale, medication(s)/side effects and non-pharmacologic comfort measures Outcome: Progressing   Problem: Health Behavior/Discharge Planning: Goal: Ability to manage health-related needs will improve Outcome: Progressing   Problem: Clinical Measurements: Goal: Ability to maintain clinical measurements within normal limits will improve Outcome: Progressing Goal: Will remain free from infection Outcome: Progressing Goal: Diagnostic test results will improve Outcome: Progressing Goal: Respiratory complications will improve Outcome: Progressing Goal: Cardiovascular complication will be avoided Outcome: Progressing   Problem: Activity: Goal: Risk for activity intolerance will decrease Outcome: Progressing   Problem: Nutrition: Goal: Adequate nutrition will be maintained Outcome: Progressing   Problem: Coping: Goal: Level of anxiety will decrease Outcome: Progressing   Problem: Elimination: Goal: Will not experience complications related to bowel motility Outcome: Progressing Goal: Will not experience  complications related to urinary retention Outcome: Progressing   Problem: Pain Managment: Goal: General experience of comfort will improve Outcome: Progressing   Problem: Safety: Goal: Ability to remain free from injury will improve Outcome: Progressing

## 2023-09-07 NOTE — Group Note (Signed)
[facility] LCSW Group Therapy Note    Group Date: 09/07/2023 Start Time: 1400 End Time: 1500  Type of Therapy and Topic:  Group Therapy:  Overcoming Obstacles  Participation Level:  [facility] PARTICIPATION LEVEL: Did Not Attend  Mood:  Description of Group:   In this group patients will be encouraged to explore what they see as obstacles to their own wellness and recovery. They will be guided to discuss their thoughts, feelings, and behaviors related to these obstacles. The group will process together ways to cope with barriers, with attention given to specific choices patients can make. Each patient will be challenged to identify changes they are motivated to make in order to overcome their obstacles. This group will be process-oriented, with patients participating in exploration of their own experiences as well as giving and receiving support and challenge from other group members.  Therapeutic Goals: 1. Patient will identify personal and current obstacles as they relate to admission. 2. Patient will identify barriers that currently interfere with their wellness or overcoming obstacles.  3. Patient will identify feelings, thought process and behaviors related to these barriers. 4. Patient will identify two changes they are willing to make to overcome these obstacles:    Summary of Patient Progress   X   Therapeutic Modalities:   Cognitive Behavioral Therapy Solution Focused Therapy Motivational Interviewing Relapse Prevention Therapy   Elza Rafter, LCSWA

## 2023-09-07 NOTE — Group Note (Signed)
Date:  09/07/2023 Time:  6:01 PM  Group Topic/Focus:  Healthy Communication:   The focus of this group is to discuss communication, barriers to communication, as well as healthy ways to communicate with others.    Participation Level:  Did Not Attend   Nicholas Horne [facility] 09/07/2023, 6:01 PM

## 2023-09-07 NOTE — Group Note (Signed)
Date:  09/07/2023 Time:  11:57 PM  Group Topic/Focus:  Wrap-Up Group:   The focus of this group is to help patients review their daily goal of treatment and discuss progress on daily workbooks.    Participation Level:  Active  Participation Quality:  Appropriate  Affect:  Appropriate  Cognitive:  Appropriate  Insight: Appropriate  Engagement in Group:  Engaged  Modes of Intervention:  Discussion   [name] 09/07/2023, 11:57 PM

## 2023-09-08 ENCOUNTER — Encounter: Payer: Self-pay | Admitting: Psychiatry

## 2023-09-08 NOTE — Progress Notes (Signed)
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 The patient has a history of heavy alcohol use, but his vital signs are currently stable. He also expressed potential interest in going to rehab.No evidence of suicidal or homicidal ideation. No delusions or hallucinations reported.The patient appears frustrated with medication side effects, particularly sedation, but is cooperative and communicative during the meeting.he patient reports sedation from his current medication regimen, impacting his ability to work. He expresses a preference for therapy over medications and is open to discussing rehab for his alcohol use. His vital signs are stable, allowing for adjustments to his detox medication.  Plan: Diagnosis: Principal Problem:   Bipolar 1 disorder, depressed (HCC)  Total Time spent with patient: 2 hours  Past Psychiatric History: Bipolar Substance Abuse  Past Medical History:  Past Medical History:  Diagnosis Date   Bipolar disorder (manic depression) (HCC)    Cervical radiculopathy    GERD (gastroesophageal reflux disease)    Hypoglycemia    Mild intermittent reactive airway disease    OSA (obstructive sleep apnea)     Past Surgical History:  Procedure Laterality Date   CERVICAL DISC ARTHROPLASTY N/A 11/02/2020   Procedure: Total disc replacement C5-7;  Surgeon: Venita Lick, MD;  Location: [facility] OR;  Service: Orthopedics;  Laterality: N/A;  3 hrs   INCISION AND DRAINAGE OF WOUND Left 05/19/2021   Procedure: IRRIGATION AND DEBRIDEMENT WOUND;  Surgeon: Knute Neu, MD;  Location: [facility] OR;  Service: Plastics;  Laterality: Left;    NASAL FRACTURE SURGERY  1993   TONSILLECTOMY     Family History:  Family History  Problem Relation Age of Onset   Healthy Mother    Diabetes Father    Heart attack Maternal Grandfather    Family Psychiatric  History:see above Social History:  Social History   Substance and Sexual Activity  Alcohol Use No     Social History   Substance and Sexual Activity  Drug Use Not Currently   Types: Cocaine   Comment: clean 5 yrs    Social History   Socioeconomic History   Marital status: Divorced    Spouse name: Not on file   Number of children: Not on file   Years of education: Not on file   Highest education level: Not on file  Occupational History   Not on file  Tobacco Use   Smoking status: Some Days    Types: Cigarettes   Smokeless tobacco: Never  Vaping Use   Vaping status: Never Used  Substance and Sexual Activity   Alcohol use: No   Drug use: Not Currently    Types: Cocaine    Comment: clean 5 yrs   Sexual activity: Not on file  Other Topics Concern   Not on file  Social History Narrative   Not on file   Social Determinants of Health   Financial Resource Strain: Not on file  Food Insecurity: No Food Insecurity (09/06/2023)   Hunger Vital Sign    Worried About Running Out of Food in the Last Year: Never true    Ran Out of Food in the Last Year: Never true  Transportation Needs: No Transportation Needs (  09/06/2023)   PRAPARE - Administrator, Civil Service (Medical): No    Lack of Transportation (Non-Medical): No  Physical Activity: Not on file  Stress: Not on file  Social Connections: Not on file   Additional Social History:                         Sleep: Fair  Appetite:  Good  Current Medications: Current Facility-Administered Medications  Medication Dose Route Frequency Provider Last Rate Last Admin   acetaminophen (TYLENOL) tablet 650 mg  650 mg Oral Q6H PRN Marlou Sa, NP       alum & mag hydroxide-simeth  (MAALOX/MYLANTA) 200-200-20 MG/5ML suspension 30 mL  30 mL Oral Q4H PRN Rayburn Go, Veronique M, NP       aspirin EC tablet 81 mg  81 mg Oral Daily Byungura, Veronique M, NP   81 mg at 09/07/23 0900   diphenhydrAMINE (BENADRYL) capsule 50 mg  50 mg Oral TID PRN Marlou Sa, NP       Or   diphenhydrAMINE (BENADRYL) injection 50 mg  50 mg Intramuscular TID PRN Marlou Sa, NP       doxepin (SINEQUAN) capsule 25 mg  25 mg Oral QHS Sarina Ill, DO   25 mg at 09/07/23 0010   haloperidol (HALDOL) tablet 5 mg  5 mg Oral TID PRN Marlou Sa, NP       Or   haloperidol lactate (HALDOL) injection 5 mg  5 mg Intramuscular TID PRN Marlou Sa, NP       hydrOXYzine (ATARAX) tablet 25 mg  25 mg Oral Q6H PRN Sarina Ill, DO       LORazepam (ATIVAN) tablet 2 mg  2 mg Oral TID PRN Marlou Sa, NP       Or   LORazepam (ATIVAN) injection 2 mg  2 mg Intramuscular TID PRN Marlou Sa, NP       magnesium hydroxide (MILK OF MAGNESIA) suspension 30 mL  30 mL Oral Daily PRN Rayburn Go, Veronique M, NP       risperiDONE (RISPERDAL) tablet 1 mg  1 mg Oral QHS Sarina Ill, DO        Lab Results: No results found for this or any previous visit (from the past 48 hour(s)).  Blood Alcohol level:  Lab Results  Component Value Date   ETH <10 09/04/2023   ETH <10 09/28/2018    Metabolic Disorder Labs: Lab Results  Component Value Date   HGBA1C 5.3 09/28/2018   MPG 105.41 09/28/2018   No results found for: "PROLACTIN" Lab Results  Component Value Date   CHOL 179 08/15/2023   TRIG 180 (H) 08/15/2023   HDL 42 08/15/2023   CHOLHDL 4.3 08/15/2023   VLDL 13 09/28/2018   LDLCALC 106 (H) 08/15/2023   LDLCALC 137 (H) 09/28/2018     Musculoskeletal: Strength & Muscle Tone: within normal limits Gait & Station: normal Patient leans: N/A  Psychiatric Specialty Exam:  Presentation  General Appearance:  Appropriate for  Environment  Eye Contact: Minimal  Speech: Clear and Coherent  Speech Volume: Normal  Handedness: Right   Mood and Affect  Mood: Irritable  Affect: Appropriate   Thought Process  Thought Processes: Coherent  Descriptions of Associations:Intact  Orientation:Full (Time, Place and Person)  Thought Content:WDL  History of Schizophrenia/Schizoaffective disorder:No  Duration of Psychotic Symptoms:No data recorded Hallucinations:Hallucinations: None  Ideas of Reference:None  Suicidal Thoughts:Suicidal  Thoughts: No SI Passive Intent and/or Plan: -- (none noted)  Homicidal Thoughts:Homicidal Thoughts: No   Sensorium  Memory: Immediate Fair; Remote Fair  Judgment: Fair  Insight: Fair   Art therapist  Concentration: Good  Attention Span: Fair  Recall: Good  Fund of Knowledge: Good  Language: Good   Psychomotor Activity  Psychomotor Activity: Psychomotor Activity: Normal   Assets  Assets: Communication Skills; Financial Resources/Insurance; Housing   Sleep  Sleep: Sleep: Fair Number of Hours of Sleep: 6    Physical Exam: Physical Exam Vitals and nursing note reviewed.  Constitutional:      Appearance: Normal appearance.  HENT:     Head: Normocephalic and atraumatic.  Pulmonary:     Effort: Pulmonary effort is normal.  Musculoskeletal:        General: Normal range of motion.     Cervical back: Normal range of motion.  Neurological:     General: No focal deficit present.     Mental Status: He is alert and oriented to person, place, and time.  Psychiatric:        Attention and Perception: Attention and perception normal.        Mood and Affect: Mood is anxious. Affect is blunt and flat.        Speech: Speech normal.        Behavior: Behavior is cooperative.        Thought Content: Thought content normal.        Cognition and Memory: Cognition and memory normal.        Judgment: Judgment normal.    ROS Blood  pressure 125/76, pulse 70, temperature 97.9 F (36.6 C), resp. rate 17, height 5\' 11"  (1.803 m), weight 85.3 kg, SpO2 98%. Body mass index is 26.22 kg/m.   Treatment Plan Summary: Daily contact with patient to assess and evaluate symptoms and progress in treatment and Medication management 1.Prioritize psychotherapy sessions to address the patient's mood disorder and coping mechanisms. Explore cognitive-behavioral strategies to manage symptoms. 2.Engage the patient in motivational interviewing to explore his readiness for change, particularly regarding his alcohol use. 3.Arrange for outpatient therapy sessions with a focus on managing mood symptoms and alcohol use. Myriam Forehand, NP 09/08/2023, 7:01 PM

## 2023-09-08 NOTE — Group Note (Signed)
Recreation Therapy Group Note   Group Topic:Health and Wellness  Group Date: 09/08/2023 Start Time: 1030 End Time: 1130 Facilitators: Rosina Lowenstein, LRT, CTRS Location: Courtyard  Group Description: [employer]. LRT and patients played games of basketball, drew with chalk, and played corn hole while outside in the courtyard while getting fresh air and sunlight. Music was being played in the background. LRT and peers conversed about different games they have played before, what they do in their free time and anything else that is on their minds. LRT encouraged pts to drink water after being outside, sweating and getting their heart rate up.  Goal Area(s) Addressed: Patient will build on frustration tolerance skills. Patients will partake in a competitive play game with peers. Patients will gain knowledge of new leisure interest/hobby.    Affect/Mood: Appropriate   Participation Level: Active and Engaged   Participation Quality: Independent   Behavior: Calm and Cooperative   Speech/Thought Process: Coherent   Insight: Fair   Judgement: Fair    Modes of Intervention: Activity   Patient Response to Interventions:  Attentive, Engaged, Interested , and Receptive   Education Outcome:  Acknowledges education   Clinical Observations/Individualized Feedback: Burk was active in their participation of session activities and group discussion. Pt sat and talked with a peer while outside in group. Pt interacted well with LRT and peers duration of session.  Plan: Continue to engage patient in RT group sessions 2-3x/week.   Rosina Lowenstein, LRT, CTRS 09/08/2023 12:17 PM

## 2023-09-08 NOTE — Plan of Care (Signed)
Problem: Education: Goal: Knowledge of General Education information will improve Description: Including pain rating scale, medication(s)/side effects and non-pharmacologic comfort measures Outcome: Progressing   Problem: Health Behavior/Discharge Planning: Goal: Ability to manage health-related needs will improve Outcome: Progressing   Problem: Clinical Measurements: Goal: Ability to maintain clinical measurements within normal limits will improve Outcome: Progressing Goal: Will remain free from infection Outcome: Progressing Goal: Diagnostic test results will improve Outcome: Progressing Goal: Respiratory complications will improve Outcome: Progressing Goal: Cardiovascular complication will be avoided Outcome: Progressing   Problem: Activity: Goal: Risk for activity intolerance will decrease Outcome: Progressing   Problem: Nutrition: Goal: Adequate nutrition will be maintained Outcome: Progressing   Problem: Coping: Goal: Level of anxiety will decrease Outcome: Progressing   Problem: Elimination: Goal: Will not experience complications related to bowel motility Outcome: Progressing Goal: Will not experience complications related to urinary retention Outcome: Progressing   Problem: Pain Managment: Goal: General experience of comfort will improve Outcome: Progressing   Problem: Safety: Goal: Ability to remain free from injury will improve Outcome: Progressing   Problem: Skin Integrity: Goal: Risk for impaired skin integrity will decrease Outcome: Progressing   Problem: Education: Goal: Knowledge of [place] General Education information/materials will improve Outcome: Progressing Goal: Emotional status will improve Outcome: Progressing Goal: Mental status will improve Outcome: Progressing Goal: Verbalization of understanding the information provided will improve Outcome: Progressing   Problem: Activity: Goal: Interest or engagement in activities will  improve Outcome: Progressing Goal: Sleeping patterns will improve Outcome: Progressing   Problem: Coping: Goal: Ability to verbalize frustrations and anger appropriately will improve Outcome: Progressing Goal: Ability to demonstrate self-control will improve Outcome: Progressing   Problem: Health Behavior/Discharge Planning: Goal: Identification of resources available to assist in meeting health care needs will improve Outcome: Progressing Goal: Compliance with treatment plan for underlying cause of condition will improve Outcome: Progressing   Problem: Physical Regulation: Goal: Ability to maintain clinical measurements within normal limits will improve Outcome: Progressing   Problem: Safety: Goal: Periods of time without injury will increase Outcome: Progressing

## 2023-09-08 NOTE — Progress Notes (Signed)
Patient remained in the bed resting through out the shift. He reported that he wanted to be discharged when he was engaged in conversation, he refused all of his medications on shift , he reports he does not want to feel sleep all day.

## 2023-09-08 NOTE — Progress Notes (Signed)
Pt refuses all PM medications, reports "I don't need those". Pt is however polite and pleasant with staff and other peers. Pt seems to be interacting appropriately with other peers as well as staff. Pt denies any needs at this time. Encouraged to let staff know if any arise. Pt denies anxiety/depression to this RN. Denies SI/HI at this time.

## 2023-09-08 NOTE — Group Note (Signed)
Date:  09/08/2023 Time:  6:21 PM  Group Topic/Focus:  Art Therapy  creating artwork, reflecting on the artwork, and connecting to personal insights. Creating artwork can help someone understand how they process their emotions    Participation Level:  Active  Participation Quality:  Appropriate  Affect:  Appropriate  Cognitive:  Appropriate  Insight: Appropriate  Engagement in Group:  Developing/Improving  Modes of Intervention:  Activity  Additional Comments:    Kylon Philbrook 09/08/2023, 6:21 PM

## 2023-09-08 NOTE — Progress Notes (Signed)
   09/08/23 1216  Psych Admission Type (Psych Patients Only)  Admission Status Voluntary  Psychosocial Assessment  Patient Complaints None  Eye Contact Brief  Facial Expression Anxious  Affect Anxious  Speech Slow  Interaction Assertive  Motor Activity Restless  Appearance/Hygiene In scrubs  Behavior Characteristics Appropriate to situation  Mood Anxious  Thought Process  Coherency WDL  Content WDL  Delusions None reported or observed  Perception WDL  Hallucination None reported or observed  Judgment WDL  Confusion WDL  Danger to Self  Current suicidal ideation? Denies  Agreement Not to Harm Self Yes  Danger to Others  Danger to Others None reported or observed   Refused AM medication. Denies SI/HI/AVH

## 2023-09-08 NOTE — Group Note (Signed)
Date:  09/08/2023 Time:  9:20 PM  Group Topic/Focus:  Wellness Toolbox:   The focus of this group is to discuss various aspects of wellness, balancing those aspects and exploring ways to increase the ability to experience wellness.  Patients will create a wellness toolbox for use upon discharge. Wrap-Up Group:   The focus of this group is to help patients review their daily goal of treatment and discuss progress on daily workbooks.    Participation Level:  Minimal  Participation Quality:  Appropriate and Attentive  Affect:  Appropriate  Cognitive:  Alert and Appropriate  Insight: Appropriate  Engagement in Group:  Limited  Modes of Intervention:  Discussion  Additional Comments:    Maglione,Carolee Channell E 09/08/2023, 9:20 PM

## 2023-09-08 NOTE — BH IP Treatment Plan (Signed)
Interdisciplinary Treatment and Diagnostic Plan Update  09/08/2023 Time of Session: 9:49 AM  HOLDEN MANISCALCO MRN: [id number]  Principal Diagnosis: Bipolar 1 disorder, depressed (HCC)  Secondary Diagnoses: Principal Problem:   Bipolar 1 disorder, depressed (HCC)   Current Medications:  Current Facility-Administered Medications  Medication Dose Route Frequency Provider Last Rate Last Admin   acetaminophen (TYLENOL) tablet 650 mg  650 mg Oral Q6H PRN Marlou Sa, NP       alum & mag hydroxide-simeth (MAALOX/MYLANTA) 200-200-20 MG/5ML suspension 30 mL  30 mL Oral Q4H PRN Marlou Sa, NP       aspirin EC tablet 81 mg  81 mg Oral Daily Byungura, Veronique M, NP   81 mg at 09/07/23 0900   diphenhydrAMINE (BENADRYL) capsule 50 mg  50 mg Oral TID PRN Marlou Sa, NP       Or   diphenhydrAMINE (BENADRYL) injection 50 mg  50 mg Intramuscular TID PRN Marlou Sa, NP       doxepin (SINEQUAN) capsule 25 mg  25 mg Oral QHS Sarina Ill, DO   25 mg at 09/07/23 0010   haloperidol (HALDOL) tablet 5 mg  5 mg Oral TID PRN Marlou Sa, NP       Or   haloperidol lactate (HALDOL) injection 5 mg  5 mg Intramuscular TID PRN Marlou Sa, NP       hydrOXYzine (ATARAX) tablet 25 mg  25 mg Oral Q6H PRN Sarina Ill, DO       LORazepam (ATIVAN) tablet 2 mg  2 mg Oral TID PRN Marlou Sa, NP       Or   LORazepam (ATIVAN) injection 2 mg  2 mg Intramuscular TID PRN Marlou Sa, NP       magnesium hydroxide (MILK OF MAGNESIA) suspension 30 mL  30 mL Oral Daily PRN Rayburn Go, Veronique M, NP       risperiDONE (RISPERDAL) tablet 1 mg  1 mg Oral QHS Sarina Ill, DO       PTA Medications: Medications Prior to Admission  Medication Sig Dispense Refill Last Dose   metoprolol tartrate (LOPRESSOR) 50 MG tablet Take 1 tablet (50 mg total) by mouth once for 1 dose. ONE TO TWO HOURS PRIOR TO CARDIAC CTA 1 tablet 0     TESTOSTERONE CYPIONATE IM Inject 250 mg into the muscle once a week. At pt preference (Patient not taking: Reported on 09/04/2023)       Patient Stressors:    Patient Strengths:    Treatment Modalities: Medication Management, Group therapy, Case management,  1 to 1 session with clinician, Psychoeducation, Recreational therapy.   Physician Treatment Plan for Primary Diagnosis: Bipolar 1 disorder, depressed (HCC) Long Term Goal(s): Improvement in symptoms so as ready for discharge   Short Term Goals: Ability to identify changes in lifestyle to reduce recurrence of condition will improve Ability to verbalize feelings will improve Ability to disclose and discuss suicidal ideas Ability to demonstrate self-control will improve Ability to identify and develop effective coping behaviors will improve Ability to maintain clinical measurements within normal limits will improve Compliance with prescribed medications will improve Ability to identify triggers associated with substance abuse/mental health issues will improve  Medication Management: Evaluate patient's response, side effects, and tolerance of medication regimen.  Therapeutic Interventions: 1 to 1 sessions, Unit Group sessions and Medication administration.  Evaluation of Outcomes: Progressing  Physician Treatment Plan for Secondary Diagnosis: Principal Problem:   Bipolar 1 disorder,  depressed (HCC)  Long Term Goal(s): Improvement in symptoms so as ready for discharge   Short Term Goals: Ability to identify changes in lifestyle to reduce recurrence of condition will improve Ability to verbalize feelings will improve Ability to disclose and discuss suicidal ideas Ability to demonstrate self-control will improve Ability to identify and develop effective coping behaviors will improve Ability to maintain clinical measurements within normal limits will improve Compliance with prescribed medications will improve Ability to  identify triggers associated with substance abuse/mental health issues will improve     Medication Management: Evaluate patient's response, side effects, and tolerance of medication regimen.  Therapeutic Interventions: 1 to 1 sessions, Unit Group sessions and Medication administration.  Evaluation of Outcomes: Progressing   RN Treatment Plan for Primary Diagnosis: Bipolar 1 disorder, depressed (HCC) Long Term Goal(s): Knowledge of disease and therapeutic regimen to maintain health will improve  Short Term Goals: Ability to remain free from injury will improve, Ability to verbalize frustration and anger appropriately will improve, Ability to demonstrate self-control, Ability to participate in decision making will improve, Ability to verbalize feelings will improve, Ability to disclose and discuss suicidal ideas, Ability to identify and develop effective coping behaviors will improve, and Compliance with prescribed medications will improve  Medication Management: RN will administer medications as ordered by provider, will assess and evaluate patient's response and provide education to patient for prescribed medication. RN will report any adverse and/or side effects to prescribing provider.  Therapeutic Interventions: 1 on 1 counseling sessions, Psychoeducation, Medication administration, Evaluate responses to treatment, Monitor vital signs and CBGs as ordered, Perform/monitor CIWA, COWS, AIMS and Fall Risk screenings as ordered, Perform wound care treatments as ordered.  Evaluation of Outcomes: Progressing   LCSW Treatment Plan for Primary Diagnosis: Bipolar 1 disorder, depressed (HCC) Long Term Goal(s): Safe transition to appropriate next level of care at discharge, Engage patient in therapeutic group addressing interpersonal concerns.  Short Term Goals: Engage patient in aftercare planning with referrals and resources, Increase social support, Increase ability to appropriately verbalize  feelings, Increase emotional regulation, Facilitate acceptance of mental health diagnosis and concerns, Facilitate patient progression through stages of change regarding substance use diagnoses and concerns, Identify triggers associated with mental health/substance abuse issues, and Increase skills for wellness and recovery  Therapeutic Interventions: Assess for all discharge needs, 1 to 1 time with Social worker, Explore available resources and support systems, Assess for adequacy in community support network, Educate family and significant other(s) on suicide prevention, Complete Psychosocial Assessment, Interpersonal group therapy.  Evaluation of Outcomes: Progressing   Progress in Treatment: Attending groups: No. Participating in groups: No. Taking medication as prescribed: No. Toleration medication: No. Family/Significant other contact made: No, will contact:  CSW will contact if given permission  Patient understands diagnosis: Yes. Discussing patient identified problems/goals with staff: Yes. Medical problems stabilized or resolved: Yes. Denies suicidal/homicidal ideation: Yes. Issues/concerns per patient self-inventory: No. Other: None  New problem(s) identified: No, Describe:  None identified   New Short Term/Long Term Goal(s): elimination of symptoms of psychosis, medication management for mood stabilization; elimination of SI thoughts; development of comprehensive mental wellness plan.   Patient Goals:  " I think I needed a reset, I want to check into outpatient and therapy"   Discharge Plan or Barriers: CSW will assist with appropriate discharge planning   Reason for Continuation of Hospitalization: Depression Medication stabilization  Estimated Length of Stay: 1 to 7 days   Last 3 [place] Suicide Severity Risk Score: Flowsheet Row Admission (Current)  from 09/05/2023 in [facility] ED from 09/04/2023 in [facility] Department at [facility] ED from 06/09/2023 in [facility] Emergency Department at [facility]  C-SSRS RISK CATEGORY High Risk High Risk No Risk       Last PHQ 2/9 Scores:    10/18/2020   10:15 AM 08/01/2020    4:43 PM  Depression screen PHQ 2/9  Decreased Interest 0 0  Down, Depressed, Hopeless 0 0  PHQ - 2 Score 0 0  Altered sleeping 0 2  Tired, decreased energy 0 2  Change in appetite 0 0  Feeling bad or failure about yourself  0 0  Trouble concentrating 0 0  Moving slowly or fidgety/restless 0 0  Suicidal thoughts  0  PHQ-9 Score 0 4    Scribe for Treatment Team: Elza Rafter, [place] 09/08/2023 10:08 AM

## 2023-09-08 NOTE — Group Note (Signed)
Date:  09/08/2023 Time:  10:03 AM  Group Topic/Focus:  Dimensions of Wellness:   The focus of this group is to introduce the topic of wellness and discuss the role each dimension of wellness plays in total health.    Participation Level:  Active  Participation Quality:  Appropriate  Affect:  Appropriate  Cognitive:  Appropriate  Insight: Appropriate  Engagement in Group:  Developing/Improving  Modes of Intervention:  Activity  Additional Comments:    Nicholas Horne 09/08/2023, 10:03 AM

## 2023-09-09 DIAGNOSIS — F319 Bipolar disorder, unspecified: Secondary | ICD-10-CM | POA: Diagnosis not present

## 2023-09-09 LAB — GLUCOSE, CAPILLARY: Glucose-Capillary: 141 mg/dL — ABNORMAL HIGH (ref 70–99)

## 2023-09-09 MED ORDER — RISPERIDONE 1 MG PO TABS: 1.0000 mg | ORAL_TABLET | Freq: Every day | ORAL | 0 refills | Status: AC

## 2023-09-09 MED ORDER — DOXEPIN HCL 25 MG PO CAPS: 25.0000 mg | ORAL_CAPSULE | Freq: Every day | ORAL | 0 refills | Status: AC

## 2023-09-09 MED ORDER — ASPIRIN 81 MG PO TBEC: 81.0000 mg | DELAYED_RELEASE_TABLET | Freq: Every day | ORAL | 0 refills | Status: AC

## 2023-09-09 NOTE — Progress Notes (Signed)
Patient is discharging at this time. Patient is A&Ox4. Stable. Patient denies SI,HI, and A/V/H with no plan/intent. Printed AVS reviewed with and given to patient along with medications and follow up appointments. Suicide safety plan complete with copy provided to patient. Original form in assigned binder. Patient verbalized all understanding. All valuables/belongings returned to patient. Patient is being transported by his wife. Patient denies any pain/discomfort. No s/s of current distress.

## 2023-09-09 NOTE — Group Note (Signed)
Date:  09/09/2023 Time:  12:43 PM  Group Topic/Focus: Mindfulness is a practice that involves being aware of the present moment and directing your attention inward. It can help you focus your mind, especially when you're feeling distracted, overwhelmed, or stressed.     Participation Level:  Active  Participation Quality:  Appropriate  Affect:  Appropriate  Cognitive:  Alert, Appropriate, and Oriented  Insight: Appropriate  Engagement in Group:  Developing/Improving and Engaged  Modes of Intervention:  Activity, Discussion, Education, and Socialization  Additional Comments:    Nicholas Horne 09/09/2023, 12:43 PM

## 2023-09-09 NOTE — [facility] Suicide Risk Assessment (Signed)
[facility] Discharge Suicide Risk Assessment   Principal Problem: Bipolar 1 disorder, depressed (HCC) Discharge Diagnoses: Principal Problem:   Bipolar 1 disorder, depressed (HCC)   Total Time spent with patient: 2 hours  Musculoskeletal: Strength & Muscle Tone: within normal limits Gait & Station: normal Patient leans: N/A  Psychiatric Specialty Exam  Presentation  General Appearance:  Appropriate for Environment  Eye Contact: Good  Speech: Clear and Coherent  Speech Volume: Normal  Handedness: Right   Mood and Affect  Mood: Anxious (about discharge)  Duration of Depression Symptoms: Greater than two weeks  Affect: Flat   Thought Process  Thought Processes: Coherent  Descriptions of Associations:Intact  Orientation:Full (Time, Place and Person) (and situation)  Thought Content:WDL  History of Schizophrenia/Schizoaffective disorder:No  Duration of Psychotic Symptoms:No data recorded Hallucinations:Hallucinations: None  Ideas of Reference:None  Suicidal Thoughts:Suicidal Thoughts: No SI Passive Intent and/or Plan: -- (none noted)  Homicidal Thoughts:Homicidal Thoughts: No   Sensorium  Memory: Immediate Good; Remote Good  Judgment: Fair  Insight: Fair   Art therapist  Concentration: Good  Attention Span: Fair  Recall: Good  Fund of Knowledge: Good  Language: Good   Psychomotor Activity  Psychomotor Activity: Psychomotor Activity: Normal   Assets  Assets: Communication Skills; Desire for Improvement; Social Support; Health and safety inspector; Housing   Sleep  Sleep: Sleep: Good Number of Hours of Sleep: 8   Physical Exam: Physical Exam ROS Blood pressure (!) 94/49, pulse (!) 55, temperature (!) 97.3 F (36.3 C), resp. rate 20, height 5\' 11"  (1.803 m), weight 85.3 kg, SpO2 98%. Body mass index is 26.22 kg/m.  Mental Status Per Nursing Assessment::   On Admission:  Suicidal ideation indicated by  patient  Demographic Factors:  Male and Low socioeconomic status  Loss Factors: Financial problems/change in socioeconomic status  Historical Factors: NA  Risk Reduction Factors:   Employed, Living with another person, especially a relative, Positive social support, Positive therapeutic relationship, and Positive coping skills or problem solving skills  Continued Clinical Symptoms:  Depression:   Anhedonia Comorbid alcohol abuse/dependence Insomnia Alcohol/Substance Abuse/Dependencies More than one psychiatric diagnosis Previous Psychiatric Diagnoses and Treatments  Cognitive Features That Contribute To Risk:  Closed-mindedness    Suicide Risk:  Minimal: No identifiable suicidal ideation.  Patients presenting with no risk factors but with morbid ruminations; may be classified as minimal risk based on the severity of the depressive symptoms   Follow-up Information     [facility]  Follow up.   Why: Appointment is scheduled for 09/12/2023 at 10AM. Contact information: [address] [place] [address] [phone]                 Plan Of Care/Follow-up recommendations:  Activity:  as tolerated Diet:  heart healthy 1.Risperdal 1mg  daily 2.Doxepin 25mg  daily 3.Aspirin 81mg  daily 4.Educate the patient about the importance of medication adherence and potential strategies to manage daytime sedation, such as adjusting the timing of medication doses or considering alternative medications at future follow-ups. 5.Encourage outpatient follow-up with a psychiatrist to evaluate medication side effects and adjust the regimen as needed to reduce sedation. 6.Monitor mood and anxiety through outpatient therapy sessions and ensure the patient has access to continued mental health support. 7.The patient will be provided with resources for crisis management in case of worsening mood or suicidal ideation. 8.Consider alternative medications during outpatient  follow-up that may have less sedative effects while still managing his symptoms. Myriam Forehand, NP 09/09/2023, 12:59 PM

## 2023-09-09 NOTE — Group Note (Signed)
Recreation Therapy Group Note   Group Topic:Goal Setting  Group Date: 09/09/2023 Start Time: 1000 End Time: 1100 Facilitators: Rosina Lowenstein, LRT, CTRS Location:  Craft Room  Group Description: Scientist, research (physical sciences). Patients were given many different magazines, a glue stick, markers, and a piece of cardstock paper. LRT and pts discussed the importance of having goals in life. LRT and pts discussed the difference between short-term and long-term goals, as well as what a SMART goal is. LRT encouraged pts to create a vision board, with images they picked and then cut out with safety scissors from the magazine, for themselves, that capture their short and long-term goals. LRT encouraged pts to show and explain their vision board to the group.   Goal Area(s) Addressed:  Patient will gain knowledge of short vs. long term goals.  Patient will identify goals for themselves. Patient will practice setting SMART goals. Patient will verbalize their goals to LRT and peers.   Affect/Mood: N/A   Participation Level: Did not attend    Clinical Observations/Individualized Feedback: Nicholas Horne did not attend group.  Plan: Continue to engage patient in RT group sessions 2-3x/week.   Rosina Lowenstein, LRT, CTRS 09/09/2023 11:30 AM

## 2023-09-09 NOTE — Discharge Summary (Signed)
Physician Discharge Summary Note  Patient:  Nicholas Horne is an 52 y.o., male MRN:  [id number] DOB:  January 04, 1971 Patient phone:  [phone] (home)  Patient address:   [address] [place] [address],  Total Time spent with patient: 2 hours  Date of Admission:  09/05/2023 Date of Discharge: 09/09/2023  Reason for Admission:  52 year old Hispanic male who presented to the ER reporting passive suicidal ideation, feeling overwhelmed, and increased anxiety. He reports "slipping up" and using cocaine 1-2 days ago but denies regular use of illicit substances. He drinks alcohol occasionally to self-medicate but denies daily use or withdrawal symptoms. 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 He describes having suicidal thoughts daily, poor sleep, low energy, and paranoia about being fired from his job. He reports a long history of suicidal thoughts, especially during substance use periods, and sought help because he "didn't want to go down the rabbit hole" again. He prefers therapy over medications due to side effects from previous trials, which include Abilify (restless legs and insomnia), Seroquel (blurry vision), and Lithium (stiff joints).The patient has a history of bipolar disorder, alcohol use, and recent cocaine use, presenting with passive suicidal ideation, increased anxiety, and feelings of being overwhelmed. His symptoms are exacerbated by work stress, family responsibilities, and recent substance use. He reports a preference for therapy over medications due to past medication side effects, and he expresses interest in rehab for alcohol use. The patient is cooperative and communicative, but sedation from prior medication regimens has impacted his ability to work. Inpatient psychiatric admission is  recommended for stabilization and treatment.  Principal Problem: Bipolar 1 disorder, depressed (HCC) Discharge Diagnoses: Principal Problem:   Bipolar 1 disorder, depressed (HCC)   Past Psychiatric History: Bipolar Depression  Past Medical History:  Past Medical History:  Diagnosis Date   Bipolar disorder (manic depression) (HCC)    Cervical radiculopathy    GERD (gastroesophageal reflux disease)    Hypoglycemia    Mild intermittent reactive airway disease    OSA (obstructive sleep apnea)     Past Surgical History:  Procedure Laterality Date   CERVICAL DISC ARTHROPLASTY N/A 11/02/2020   Procedure: Total disc replacement C5-7;  Surgeon: Venita Lick, MD;  [facility];  Service: Orthopedics;  Laterality: N/A;  3 hrs   INCISION AND DRAINAGE OF WOUND Left 05/19/2021   Procedure: IRRIGATION AND DEBRIDEMENT WOUND;  Surgeon: Knute Neu, MD;  Location: [facility] OR;  Service: Plastics;  Laterality: Left;   NASAL FRACTURE SURGERY  1993   TONSILLECTOMY     Family History:  Family History  Problem Relation Age of Onset   Healthy Mother    Diabetes Father    Heart attack Maternal Grandfather    Family Psychiatric  History: see above Social History:  Social History   Substance and Sexual Activity  Alcohol Use No     Social History   Substance and Sexual Activity  Drug Use Not Currently   Types: Cocaine   Comment: clean 5 yrs    Social History   Socioeconomic History   Marital status: Divorced    Spouse name: Not on file   Number of children: Not on file   Years of education: Not on file   Highest education level: Not on file  Occupational History   Not on  file  Tobacco Use   Smoking status: Some Days    Types: Cigarettes   Smokeless tobacco: Never  Vaping Use   Vaping status: Never Used  Substance and Sexual Activity   Alcohol use: No   Drug use: Not Currently    Types: Cocaine    Comment: clean 5 yrs   Sexual activity: Not on file  Other Topics Concern    Not on file  Social History Narrative   Not on file   Social Determinants of Health   Financial Resource Strain: Not on file  Food Insecurity: No Food Insecurity (09/06/2023)   Hunger Vital Sign    Worried About Running Out of Food in the Last Year: Never true    Ran Out of Food in the Last Year: Never true  Transportation Needs: No Transportation Needs (09/06/2023)   PRAPARE - Administrator, Civil Service (Medical): No    Lack of Transportation (Non-Medical): No  Physical Activity: Not on file  Stress: Not on file  Social Connections: Not on file    Hospital Course:  The patient was admitted voluntarily for inpatient psychiatric care after presenting with passive suicidal ideation and increased anxiety. He reported stress related to his job and family responsibilities, alongside occasional alcohol and recent cocaine use. During his stay, the patient was cooperative and communicative, expressing a preference for therapy over medications due to prior negative experiences with psychiatric medications. His mood remained stable, and he denied active suicidal or homicidal ideation during his hospitalization. He was encouraged to participate in therapy, and options for alcohol rehabilitation were discussed    Musculoskeletal: Strength & Muscle Tone: within normal limits Gait & Station: normal Patient leans: N/A   Psychiatric Specialty Exam:  Presentation  General Appearance:  Appropriate for Environment  Eye Contact: Good  Speech: Clear and Coherent  Speech Volume: Normal  Handedness: Right   Mood and Affect  Mood: Anxious (about discharge)  Affect: Flat   Thought Process  Thought Processes: Coherent  Descriptions of Associations:Intact  Orientation:Full (Time, Place and Person) (and situation)  Thought Content:WDL  History of Schizophrenia/Schizoaffective disorder:No  Duration of Psychotic Symptoms:none  reported Hallucinations:Hallucinations: None  Ideas of Reference:None  Suicidal Thoughts:Suicidal Thoughts: No SI Passive Intent and/or Plan: -- (none noted)  Homicidal Thoughts:Homicidal Thoughts: No   Sensorium  Memory: Immediate Good; Remote Good  Judgment: Fair  Insight: Fair   Art therapist  Concentration: Good  Attention Span: Fair  Recall: Good  Fund of Knowledge: Good  Language: Good   Psychomotor Activity  Psychomotor Activity: Psychomotor Activity: Normal   Assets  Assets: Communication Skills; Desire for Improvement; Social Support; Health and safety inspector; Housing   Sleep  Sleep: Sleep: Good Number of Hours of Sleep: 8    Physical Exam: Physical Exam Vitals and nursing note reviewed.  Constitutional:      Appearance: Normal appearance.  HENT:     Head: Normocephalic and atraumatic.     Nose: Nose normal.  Pulmonary:     Effort: Pulmonary effort is normal.  Musculoskeletal:        General: Normal range of motion.     Cervical back: Normal range of motion.  Neurological:     General: No focal deficit present.     Mental Status: He is alert and oriented to person, place, and time.  Psychiatric:        Attention and Perception: Attention and perception normal.        Mood and Affect: Affect is  blunt and flat.        Speech: Speech normal.        Behavior: Behavior normal. Behavior is cooperative.        Thought Content: Thought content normal.        Cognition and Memory: Cognition and memory normal.        Judgment: Judgment normal.    Review of Systems  All other systems reviewed and are negative.  Blood pressure (!) 147/87, pulse 83, temperature (!) 97.3 F (36.3 C), resp. rate 18, height 5\' 11"  (1.803 m), weight 85.3 kg, SpO2 99%. Body mass index is 26.22 kg/m.   Social History   Tobacco Use  Smoking Status Some Days   Types: Cigarettes  Smokeless Tobacco Never   Tobacco Cessation:  Prescription  not provided because: refused   Blood Alcohol level:  Lab Results  Component Value Date   ETH <10 09/04/2023   ETH <10 09/28/2018    Metabolic Disorder Labs:  Lab Results  Component Value Date   HGBA1C 5.3 09/28/2018   MPG 105.41 09/28/2018   No results found for: "PROLACTIN" Lab Results  Component Value Date   CHOL 179 08/15/2023   TRIG 180 (H) 08/15/2023   HDL 42 08/15/2023   CHOLHDL 4.3 08/15/2023   VLDL 13 09/28/2018   LDLCALC 106 (H) 08/15/2023   LDLCALC 137 (H) 09/28/2018    See Psychiatric Specialty Exam and Suicide Risk Assessment completed by Attending Physician prior to discharge.  Discharge destination:  Home  Is patient on multiple antipsychotic therapies at discharge:  No   Has Patient had three or more failed trials of antipsychotic monotherapy by history:  No  Recommended Plan for Multiple Antipsychotic Therapies: NA   Allergies as of 09/09/2023       Reactions   Lithium Other (See Comments)   Stiff joints   Seroquel [quetiapine] Photosensitivity   Blurry Vision.   Abilify [aripiprazole] Other (See Comments)   Suicidal thoughts, restless legs, insomnia.   Gabapentin    Felt funny        Medication List     STOP taking these medications    metoprolol tartrate 50 MG tablet Commonly known as: LOPRESSOR   TESTOSTERONE CYPIONATE IM       TAKE these medications      Indication  aspirin EC 81 MG tablet Take 1 tablet (81 mg total) by mouth daily. Swallow whole. 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  Indication: Feeling Anxious   risperiDONE 1 MG tablet Commonly known as: RISPERDAL Take 1 tablet (1 mg total) by mouth at bedtime.  Indication: Major Depressive Disorder        Follow-up Information     [facility]  Follow up.   Why: Appointment is  scheduled for 09/12/2023 at 10AM. Contact information: [address] [place] [address] [phone]                 Follow-up recommendations:  Activity:  as tolerated Diet:  heart healthty  Comments:   1.Risperdal 1mg  daily 2.Doxepin 25mg  daily 3.Aspirin 81mg  daily 4.Educate the patient about the importance of medication adherence and potential strategies to manage daytime sedation, such as adjusting the timing of medication doses or considering alternative medications at future follow-ups. 5.Encourage outpatient follow-up with a psychiatrist to evaluate medication side  effects and adjust the regimen as needed to reduce sedation. 6.Monitor mood and anxiety through outpatient therapy sessions and ensure the patient has access to continued mental health support. 7.The patient will be provided with resources for crisis management in case of worsening mood or suicidal ideation. 8.Consider alternative medications during outpatient follow-up that may have less sedative effects while still managing his symptoms.   Signed: Myriam Forehand, NP 09/09/2023, 1:11 PM

## 2023-09-09 NOTE — Plan of Care (Signed)
Problem: Education: Goal: Knowledge of General Education information will improve Description: Including pain rating scale, medication(s)/side effects and non-pharmacologic comfort measures Outcome: Not Progressing   Problem: Health Behavior/Discharge Planning: Goal: Ability to manage health-related needs will improve Outcome: Not Progressing   Problem: Clinical Measurements: Goal: Will remain free from infection Outcome: Progressing

## 2023-09-09 NOTE — Progress Notes (Signed)
  [facility] Adult Case Management Discharge Plan :  Will you be returning to the same living situation after discharge:  Yes,  pt is returning home.  At discharge, do you have transportation home?: Yes,  pt reports that his wife will provided transportation.  Do you have the ability to pay for your medications: Yes,  [employer] / [employer] PPO  Release of information consent forms completed and in the chart;  Patient's signature needed at discharge.  Patient to Follow up at:  Follow-up Information     [facility] Follow up.   Why: Appointment is scheduled for 09/12/2023 at 10AM. Contact information: [address] [place] [address] [phone]                 Next level of care provider has access to [facility] Link:no  Safety Planning and Suicide Prevention discussed: Yes,  SPE completed with the patient.      Has patient been referred to the Quitline?: Patient refused referral for treatment  Patient has been referred for addiction treatment: Patient refused referral for treatment.  Harden Mo, LCSW 09/09/2023, 11:51 AM

## 2023-09-10 ENCOUNTER — Ambulatory Visit: Payer: Commercial Managed Care - PPO | Attending: Cardiology

## 2023-09-23 ENCOUNTER — Emergency Department: Payer: Commercial Managed Care - PPO

## 2023-09-23 ENCOUNTER — Other Ambulatory Visit: Payer: Self-pay

## 2023-09-23 ENCOUNTER — Emergency Department
Admission: EM | Admit: 2023-09-23 | Discharge: 2023-09-23 | Disposition: A | Discharge status: Home / Self Care | Payer: Commercial Managed Care - PPO | Attending: Student in an Organized Health Care Education/Training Program | Admitting: Student in an Organized Health Care Education/Training Program

## 2023-09-23 DIAGNOSIS — H538 Other visual disturbances: Secondary | ICD-10-CM | POA: Diagnosis not present

## 2023-09-23 DIAGNOSIS — R519 Headache, unspecified: Secondary | ICD-10-CM | POA: Diagnosis present

## 2023-09-23 DIAGNOSIS — R42 Dizziness and giddiness: Secondary | ICD-10-CM | POA: Insufficient documentation

## 2023-09-23 LAB — URINALYSIS, ROUTINE W REFLEX MICROSCOPIC
Bilirubin Urine: NEGATIVE
Glucose, UA: NEGATIVE mg/dL
Hgb urine dipstick: NEGATIVE
Ketones, ur: NEGATIVE mg/dL
Leukocytes,Ua: NEGATIVE
Nitrite: NEGATIVE
Protein, ur: NEGATIVE mg/dL
Specific Gravity, Urine: 1.018 (ref 1.005–1.030)
pH: 6 (ref 5.0–8.0)

## 2023-09-23 LAB — TROPONIN I (HIGH SENSITIVITY): Troponin I (High Sensitivity): 8 ng/L (ref <18)

## 2023-09-23 LAB — CBC
HCT: 41.8 % (ref 39.0–52.0)
Hemoglobin: 14.2 g/dL (ref 13.0–17.0)
MCH: 30.9 pg (ref 26.0–34.0)
MCHC: 34 g/dL (ref 30.0–36.0)
MCV: 90.9 fL (ref 80.0–100.0)
Platelets: 261 10*3/uL (ref 150–400)
RBC: 4.6 MIL/uL (ref 4.22–5.81)
RDW: 15.8 % — ABNORMAL HIGH (ref 11.5–15.5)
WBC: 10.1 10*3/uL (ref 4.0–10.5)
nRBC: 0 % (ref 0.0–0.2)

## 2023-09-23 LAB — BASIC METABOLIC PANEL
Anion gap: 7 (ref 5–15)
BUN: 12 mg/dL (ref 6–20)
CO2: 24 mmol/L (ref 22–32)
Calcium: 8.5 mg/dL — ABNORMAL LOW (ref 8.9–10.3)
Chloride: 106 mmol/L (ref 98–111)
Creatinine, Ser: 0.87 mg/dL (ref 0.61–1.24)
GFR, Estimated: >60 mL/min (ref >60)
Glucose, Bld: 107 mg/dL — ABNORMAL HIGH (ref 70–99)
Potassium: 3.9 mmol/L (ref 3.5–5.1)
Sodium: 137 mmol/L (ref 135–145)

## 2023-09-23 MED ORDER — HYDROXYZINE HCL 25 MG PO TABS: 25.0000 mg | ORAL_TABLET | Freq: Three times a day (TID) | ORAL | 0 refills | Status: AC | PRN

## 2023-09-23 MED ORDER — ACETAMINOPHEN 500 MG PO TABS
1000.0000 mg | ORAL_TABLET | Freq: Once | ORAL | Status: AC
Start: 2023-09-23 — End: 2023-09-23
  Administered 2023-09-23: 1000 mg via ORAL
  Filled 2023-09-23: qty 2

## 2023-09-23 MED ORDER — HYDROXYZINE HCL 25 MG PO TABS
25.0000 mg | ORAL_TABLET | Freq: Once | ORAL | Status: AC
  Administered 2023-09-23: 25 mg via ORAL
  Filled 2023-09-23: qty 1

## 2023-09-23 NOTE — ED Triage Notes (Signed)
Pt here with a headache and blurred vision since Sun. Pt having tingling on the right side of his face but denies weakness. Face symmetrical and speech clear. Pt ambulatory to triage.

## 2023-09-23 NOTE — ED Provider Notes (Signed)
[facility] Provider Note    Event Date/Time   First MD Initiated Contact with Patient 09/23/23 1238     (approximate)   History   Headache and Blurred Vision   HPI  Nicholas Horne is a 52 y.o. male presents to the ER for evaluation of several days of head fullness stress headache feels like he has had some tingling.  Concerned his blood pressure was elevated.  Denies any nausea or vomiting no chest pain.  Has had similar symptoms in the past.  Does have a history of bipolar disorder.  Denies any substance abuse recently.  Denies any SI or HI.     Physical Exam   Triage Vital Signs: ED Triage Vitals  Encounter Vitals Group     BP 09/23/23 1202 136/81     Systolic BP Percentile --      Diastolic BP Percentile --      Pulse Rate 09/23/23 1202 92     Resp 09/23/23 1202 17     Temp 09/23/23 1202 97.8 F (36.6 C)     Temp Source 09/23/23 1202 Oral     SpO2 09/23/23 1202 100 %     Weight 09/23/23 1203 188 lb 0.8 oz (85.3 kg)     Height 09/23/23 1203 5\' 11"  (1.803 m)     Head Circumference --      Peak Flow --      Pain Score 09/23/23 1203 5     Pain Loc --      Pain Education --      Exclude from Growth Chart --     Most recent vital signs: Vitals:   09/23/23 1202  BP: 136/81  Pulse: 92  Resp: 17  Temp: 97.8 F (36.6 C)  SpO2: 100%     Constitutional: Alert, anxious appearing Eyes: Conjunctivae are normal.  Head: Atraumatic. Nose: No congestion/rhinnorhea. Mouth/Throat: Mucous membranes are moist.   Neck: Painless ROM.  Cardiovascular:   Good peripheral circulation. Respiratory: Normal respiratory effort.  No retractions.  Gastrointestinal: Soft and nontender.  Musculoskeletal:  no deformity Neurologic:  MAE spontaneously. No gross focal neurologic deficits are appreciated.  Skin:  Skin is warm, dry and intact. No rash noted. Psychiatric: Mood and affect are anxious    ED Results / Procedures / Treatments   Labs (all labs  ordered are listed, but only abnormal results are displayed) Labs Reviewed  BASIC METABOLIC PANEL - Abnormal; Notable for the following components:      Result Value   Glucose, Bld 107 (*)    Calcium 8.5 (*)    All other components within normal limits  CBC - Abnormal; Notable for the following components:   RDW 15.8 (*)    All other components within normal limits  URINALYSIS, ROUTINE W REFLEX MICROSCOPIC - Abnormal; Notable for the following components:   Color, Urine YELLOW (*)    APPearance CLEAR (*)    All other components within normal limits  CBG MONITORING, ED  TROPONIN I (HIGH SENSITIVITY)     EKG  ED ECG REPORT I, Willy Eddy, the attending physician, personally viewed and interpreted this ECG.   Date: 09/23/2023  EKG Time: 12:06  Rate: 80  Rhythm: sinus  Axis: normal  Intervals: normal  ST&T Change: no stemi, no depression    RADIOLOGY Please see ED Course for my review and interpretation.  I personally reviewed all radiographic images ordered to evaluate for the above acute complaints and reviewed radiology reports  and findings.  These findings were personally discussed with the patient.  Please see medical record for radiology report.    PROCEDURES:  Critical Care performed: No  Procedures   MEDICATIONS ORDERED IN ED: Medications  hydrOXYzine (ATARAX) tablet 25 mg (25 mg Oral Given 09/23/23 1305)  acetaminophen (TYLENOL) tablet 1,000 mg (1,000 mg Oral Given 09/23/23 1305)     IMPRESSION / MDM / ASSESSMENT AND PLAN / ED COURSE  I reviewed the triage vital signs and the nursing notes.                              Differential diagnosis includes, but is not limited to, SDH, IPH, CVA, electrolyte abnormality, stress reaction, anxiety, substance abuse, withdrawal,  Patient presenting to the ER for evaluation of symptoms as described above.  Based on symptoms, risk factors and considered above differential, this presenting complaint could  reflect a potentially life-threatening illness therefore the patient will be placed on continuous pulse oximetry and telemetry for monitoring.  Laboratory evaluation will be sent to evaluate for the above complaints.  CT imaging ordered at triage.  Does not have any focal deficits to suggest CVA.  Clinically I suspect more stress reaction.  Will order blood work as well as cardiac enzyme.  Will give Atarax and reassess.    Clinical Course as of 09/23/23 1410  Tue Sep 23, 2023  1406 Reassessed.  Feels significantly improved.  I do suspect large component of anxiety or stress reaction.  He does appear stable and appropriate for outpatient follow-up. [PR]    Clinical Course User Index [PR] Willy Eddy, MD     FINAL CLINICAL IMPRESSION(S) / ED DIAGNOSES   Final diagnoses:  Dizziness     Rx / DC Orders   ED Discharge Orders          Ordered    Ambulatory Referral to Primary Care (Establish Care)        09/23/23 1408    hydrOXYzine (ATARAX) 25 MG tablet  Every 8 hours PRN        09/23/23 1408             Note:  This document was prepared using Dragon voice recognition software and may include unintentional dictation errors.    Willy Eddy, MD 09/23/23 1410

## 2023-09-26 ENCOUNTER — Encounter: Payer: Self-pay | Admitting: Cardiology

## 2023-09-26 ENCOUNTER — Ambulatory Visit: Payer: Commercial Managed Care - PPO | Attending: Cardiology | Admitting: Cardiology

## 2023-09-30 ENCOUNTER — Encounter (HOSPITAL_COMMUNITY): Payer: Self-pay

## 2023-10-01 ENCOUNTER — Telehealth (HOSPITAL_COMMUNITY): Payer: Self-pay | Admitting: *Deleted

## 2023-10-01 NOTE — Telephone Encounter (Signed)
Attempted to call patient regarding upcoming cardiac CT appointment. Unable to leave VM. Damareon Lanni RN Navigator Cardiac Imaging [facility] [phone] Office  

## 2023-10-02 ENCOUNTER — Ambulatory Visit: Admission: RE | Admit: 2023-10-02 | Payer: Commercial Managed Care - PPO | Source: Ambulatory Visit

## 2023-11-04 ENCOUNTER — Encounter (HOSPITAL_COMMUNITY): Payer: Self-pay

## 2023-12-29 ENCOUNTER — Ambulatory Visit (INDEPENDENT_AMBULATORY_CARE_PROVIDER_SITE_OTHER): Payer: Commercial Managed Care - PPO

## 2023-12-29 ENCOUNTER — Ambulatory Visit
Admission: EM | Admit: 2023-12-29 | Discharge: 2023-12-29 | Disposition: A | Discharge status: Home / Self Care | Payer: Commercial Managed Care - PPO | Attending: Physician Assistant | Admitting: Physician Assistant

## 2023-12-29 DIAGNOSIS — J209 Acute bronchitis, unspecified: Secondary | ICD-10-CM

## 2023-12-29 DIAGNOSIS — R051 Acute cough: Secondary | ICD-10-CM

## 2023-12-29 MED ORDER — DOXYCYCLINE HYCLATE 100 MG PO CAPS: 100.0000 mg | ORAL_CAPSULE | Freq: Two times a day (BID) | ORAL | 0 refills | Status: AC

## 2023-12-29 MED ORDER — ALBUTEROL SULFATE HFA 108 (90 BASE) MCG/ACT IN AERS: 2.0000 | INHALATION_SPRAY | Freq: Four times a day (QID) | RESPIRATORY_TRACT | 1 refills | Status: AC | PRN

## 2023-12-29 NOTE — Discharge Instructions (Signed)
 Return if any problems.

## 2023-12-29 NOTE — ED Provider Notes (Signed)
[name]    CSN: [id number] Arrival date & time: 12/29/23  0803      History   Chief Complaint Chief Complaint  Patient presents with   Chest Pain   Shortness of Breath    HPI MICAHEL Horne is a 53 y.o. male.   Patient complains of a cough and congestion.  Patient reports that he has had the symptoms for the past week.  Patient reports he is having pain in his chest when he coughs.  Patient reports that he has not had a fever.  Patient reports no relief with over-the-counter medications.  He has pain in his chest when he coughs.   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   OSA (obstructive sleep apnea) 10/30/2020   Tobacco use disorder 09/29/2018   Cocaine use disorder, moderate, dependence (HCC) 09/28/2018   Passive suicidal ideations 09/28/2018   Bipolar I disorder, most recent episode depressed, severe without psychotic features (HCC) 09/28/2018    Past Surgical History:  Procedure Laterality Date   CERVICAL DISC ARTHROPLASTY N/A 11/02/2020   Procedure: Total disc replacement C5-7;  Surgeon: Venita Lick, MD;  Location: [facility] OR;  Service: Orthopedics;  Laterality: N/A;  3 hrs   INCISION AND DRAINAGE OF WOUND Left 05/19/2021   Procedure: IRRIGATION AND DEBRIDEMENT WOUND;  Surgeon: Knute Neu, MD;  Location: [facility] OR;  Service: Plastics;  Laterality: Left;   NASAL FRACTURE  SURGERY  1993   TONSILLECTOMY         Home Medications    Prior to Admission medications   Medication Sig Start Date End Date Taking? Authorizing Provider  albuterol (VENTOLIN HFA) 108 (90 Base) MCG/ACT inhaler Inhale 2 puffs into the lungs every 6 (six) hours as needed for wheezing or shortness of breath. 12/29/23  Yes Cheron Schaumann K, PA-C  doxycycline (VIBRAMYCIN) 100 MG capsule Take 1 capsule (100 mg total) by mouth 2 (two) times daily. 12/29/23  Yes Cheron Schaumann K, PA-C  doxepin (SINEQUAN) 25 MG capsule Take 1 capsule (25 mg total) by mouth at bedtime. 09/09/23 10/09/23  Myriam Forehand, NP  hydrOXYzine (ATARAX) 25 MG tablet Take 1 tablet (25 mg total) by mouth every 8 (eight) hours as needed for anxiety. 09/23/23   Willy Eddy, MD  risperiDONE (RISPERDAL) 1 MG tablet Take 1 tablet (1 mg total) by mouth at bedtime. 09/09/23 10/09/23  Myriam Forehand, NP    Family History Family History  Problem Relation Age of Onset   Healthy Mother    Diabetes Father    Heart attack Maternal Grandfather     Social History Social History   Tobacco Use   Smoking status: Some Days    Types: Cigarettes   Smokeless tobacco: Never  Vaping Use   Vaping status: Never Used  Substance Use Topics   Alcohol use: No   Drug use: Not Currently    Types: Cocaine  Comment: clean 5 yrs     Allergies   Lithium, Seroquel [quetiapine], Abilify [aripiprazole], and Gabapentin   Review of Systems Review of Systems  Respiratory:  Positive for shortness of breath.   Cardiovascular:  Positive for chest pain.  All other systems reviewed and are negative.    Physical Exam Triage Vital Signs ED Triage Vitals  Encounter Vitals Group     BP 12/29/23 0825 124/85     Systolic BP Percentile --      Diastolic BP Percentile --      Pulse Rate 12/29/23 0825 80     Resp 12/29/23 0825 (!) 24     Temp 12/29/23 0825 97.9 F (36.6 C)     Temp Source 12/29/23 0825 Oral     SpO2 12/29/23 0825 96 %      Weight --      Height --      Head Circumference --      Peak Flow --      Pain Score 12/29/23 0821 4     Pain Loc --      Pain Education --      Exclude from Growth Chart --    No data found.  Updated Vital Signs BP 124/85 (BP Location: Left Arm)   Pulse 80   Temp 97.9 F (36.6 C) (Oral)   Resp (!) 24   SpO2 96%   Visual Acuity Right Eye Distance:   Left Eye Distance:   Bilateral Distance:    Right Eye Near:   Left Eye Near:    Bilateral Near:     Physical Exam Vitals and nursing note reviewed.  Constitutional:      Appearance: He is well-developed.  HENT:     Head: Normocephalic.  Cardiovascular:     Rate and Rhythm: Normal rate.     Heart sounds: Normal heart sounds.  Pulmonary:     Effort: Pulmonary effort is normal.  Abdominal:     General: There is no distension.  Musculoskeletal:        General: Normal range of motion.     Cervical back: Normal range of motion.  Skin:    General: Skin is warm.  Neurological:     General: No focal deficit present.     Mental Status: He is alert and oriented to person, place, and time.      UC Treatments / Results  Labs (all labs ordered are listed, but only abnormal results are displayed) Labs Reviewed - No data to display  EKG   Radiology DG Chest 2 View Result Date: 12/29/2023 CLINICAL DATA:  Cough.  Body aches. EXAM: CHEST - 2 VIEW COMPARISON:  06/09/2023 FINDINGS: The lungs are clear without focal pneumonia, edema, pneumothorax or pleural effusion. The cardiopericardial silhouette is within normal limits for size. No acute bony abnormality. IMPRESSION: No active cardiopulmonary disease. Electronically Signed   By: Kennith Center M.D.   On: 12/29/2023 09:24    Procedures Procedures (including critical care time)  Medications Ordered in UC Medications - No data to display  Initial Impression / Assessment and Plan / UC Course  I have reviewed the triage vital signs and the nursing notes.  Pertinent labs  & imaging results that were available during my care of the patient were reviewed by me and considered in my medical decision making (see chart for details).      Final Clinical Impressions(s) / UC Diagnoses   Final diagnoses:  Acute cough  Acute bronchitis, unspecified  organism   Chest x-ray shows no evidence of pneumonia.  Discharge Instructions      Return if any problems.    ED Prescriptions     Medication Sig Dispense Auth. Provider   doxycycline (VIBRAMYCIN) 100 MG capsule Take 1 capsule (100 mg total) by mouth 2 (two) times daily. 20 capsule Marvelene Stoneberg K, [place]   albuterol (VENTOLIN HFA) 108 (90 Base) MCG/ACT inhaler Inhale 2 puffs into the lungs every 6 (six) hours as needed for wheezing or shortness of breath. 8 g Elson Areas, [place]      PDMP not reviewed this encounter. An After Visit Summary was printed and given to the patient.       Elson Areas, [place] 12/29/23 1012

## 2023-12-29 NOTE — ED Triage Notes (Signed)
C/o s/s starting two weeks ago - HA, body aches, chest cold, etc.  Last week worsening body aches, HA, SOB, weakness. Taking OTC meds with no improvement.

## 2024-01-19 ENCOUNTER — Ambulatory Visit: Payer: Commercial Managed Care - PPO | Attending: Cardiology

## 2024-01-22 ENCOUNTER — Emergency Department: Payer: Commercial Managed Care - PPO

## 2024-01-22 ENCOUNTER — Emergency Department
Admission: EM | Admit: 2024-01-22 | Discharge: 2024-01-22 | Disposition: A | Discharge status: Home / Self Care | Payer: Commercial Managed Care - PPO | Attending: Emergency Medicine | Admitting: Emergency Medicine

## 2024-01-22 ENCOUNTER — Encounter: Payer: Self-pay | Admitting: Emergency Medicine

## 2024-01-22 ENCOUNTER — Ambulatory Visit
Admission: EM | Admit: 2024-01-22 | Discharge: 2024-01-22 | Disposition: A | Discharge status: Home / Self Care | Payer: Commercial Managed Care - PPO

## 2024-01-22 ENCOUNTER — Other Ambulatory Visit: Payer: Self-pay

## 2024-01-22 DIAGNOSIS — R519 Headache, unspecified: Secondary | ICD-10-CM

## 2024-01-22 DIAGNOSIS — L03211 Cellulitis of face: Secondary | ICD-10-CM | POA: Diagnosis not present

## 2024-01-22 DIAGNOSIS — R220 Localized swelling, mass and lump, head: Secondary | ICD-10-CM | POA: Diagnosis present

## 2024-01-22 DIAGNOSIS — H538 Other visual disturbances: Secondary | ICD-10-CM | POA: Diagnosis not present

## 2024-01-22 LAB — BASIC METABOLIC PANEL
Anion gap: 8 (ref 5–15)
BUN: 10 mg/dL (ref 6–20)
CO2: 26 mmol/L (ref 22–32)
Calcium: 8.9 mg/dL (ref 8.9–10.3)
Chloride: 101 mmol/L (ref 98–111)
Creatinine, Ser: 0.81 mg/dL (ref 0.61–1.24)
GFR, Estimated: >60 mL/min (ref >60)
Glucose, Bld: 98 mg/dL (ref 70–99)
Potassium: 3.8 mmol/L (ref 3.5–5.1)
Sodium: 135 mmol/L (ref 135–145)

## 2024-01-22 LAB — CBC WITH DIFFERENTIAL/PLATELET
Abs Immature Granulocytes: 0.03 10*3/uL (ref 0.00–0.07)
Basophils Absolute: 0 10*3/uL (ref 0.0–0.1)
Basophils Relative: 0 %
Eosinophils Absolute: 0.1 10*3/uL (ref 0.0–0.5)
Eosinophils Relative: 1 %
HCT: 43.1 % (ref 39.0–52.0)
Hemoglobin: 14.7 g/dL (ref 13.0–17.0)
Immature Granulocytes: 0 %
Lymphocytes Relative: 26 %
Lymphs Abs: 2.4 10*3/uL (ref 0.7–4.0)
MCH: 31.3 pg (ref 26.0–34.0)
MCHC: 34.1 g/dL (ref 30.0–36.0)
MCV: 91.9 fL (ref 80.0–100.0)
Monocytes Absolute: 1.1 10*3/uL — ABNORMAL HIGH (ref 0.1–1.0)
Monocytes Relative: 12 %
Neutro Abs: 5.4 10*3/uL (ref 1.7–7.7)
Neutrophils Relative %: 61 %
Platelets: 211 10*3/uL (ref 150–400)
RBC: 4.69 MIL/uL (ref 4.22–5.81)
RDW: 14.7 % (ref 11.5–15.5)
WBC: 9.1 10*3/uL (ref 4.0–10.5)
nRBC: 0 % (ref 0.0–0.2)

## 2024-01-22 MED ORDER — CLINDAMYCIN HCL 150 MG PO CAPS: 450.0000 mg | ORAL_CAPSULE | Freq: Three times a day (TID) | ORAL | 0 refills | Status: AC

## 2024-01-22 MED ORDER — FLUORESCEIN SODIUM 1 MG OP STRP
1.0000 | ORAL_STRIP | Freq: Once | OPHTHALMIC | Status: AC
  Administered 2024-01-22: 1 via OPHTHALMIC
  Filled 2024-01-22: qty 1

## 2024-01-22 MED ORDER — TETRACAINE HCL 0.5 % OP SOLN
1.0000 [drp] | Freq: Once | OPHTHALMIC | Status: AC
  Administered 2024-01-22: 1 [drp] via OPHTHALMIC
  Filled 2024-01-22: qty 4

## 2024-01-22 MED ORDER — IOHEXOL 300 MG/ML  SOLN
75.0000 mL | Freq: Once | INTRAMUSCULAR | Status: AC | PRN
  Administered 2024-01-22: 75 mL via INTRAVENOUS

## 2024-01-22 NOTE — ED Provider Notes (Signed)
 [facility]    CSN: [id number] Arrival date & time: 01/22/24  0843      History   Chief Complaint Chief Complaint  Patient presents with   Eye Problem   Shortness of Breath    HPI Nicholas Horne is a 53 y.o. male.   HPI  53 year old male with past medical history significant for mild intermittent reactive airway disease, OSA, GERD, bipolar disorder, and substance use disorder presents for evaluation of blurry vision in his right eye that started 2 days ago.  He reports that it initially resolved yesterday but then it returned yesterday while he was driving home from work.  He also developed a "knot" on the right side of his head as well as right sided facial swelling.  He reports decreased hearing in his right ear.  He also endorses some shortness of breath that started this morning.  He denies any fevers.  He has had a headache.  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   OSA (obstructive sleep apnea) 10/30/2020   Tobacco use disorder 09/29/2018   Cocaine use disorder, moderate, dependence (HCC) 09/28/2018   Passive suicidal ideations 09/28/2018   Bipolar I disorder, most recent episode depressed, severe without psychotic features (HCC) 09/28/2018    Past Surgical History:  Procedure Laterality Date   CERVICAL DISC ARTHROPLASTY N/A 11/02/2020   Procedure: Total disc replacement C5-7;  Surgeon: Venita Lick, MD;  Location: [facility] OR;  Service: Orthopedics;  Laterality: N/A;  3 hrs   INCISION AND  DRAINAGE OF WOUND Left 05/19/2021   Procedure: IRRIGATION AND DEBRIDEMENT WOUND;  Surgeon: Knute Neu, MD;  Location: [facility] OR;  Service: Plastics;  Laterality: Left;   NASAL FRACTURE SURGERY  1993   TONSILLECTOMY         Home Medications    Prior to Admission medications   Medication Sig Start Date End Date Taking? Authorizing Provider  albuterol (VENTOLIN HFA) 108 (90 Base) MCG/ACT inhaler Inhale 2 puffs into the lungs every 6 (six) hours as needed for wheezing or shortness of breath. 12/29/23   Elson Areas, PA-C  doxepin (SINEQUAN) 25 MG capsule Take 1 capsule (25 mg total) by mouth at bedtime. 09/09/23 10/09/23  Myriam Forehand, NP  doxycycline (VIBRAMYCIN) 100 MG capsule Take 1 capsule (100 mg total) by mouth 2 (two) times daily. 12/29/23   Elson Areas, PA-C  hydrOXYzine (ATARAX) 25 MG tablet Take 1 tablet (25 mg total) by mouth every 8 (eight) hours as needed for anxiety. 09/23/23   Willy Eddy, MD  risperiDONE (RISPERDAL) 1 MG tablet Take 1 tablet (1 mg total) by mouth at bedtime. 09/09/23 10/09/23  Myriam Forehand, NP    Family History Family History  Problem Relation Age of Onset   Healthy Mother    Diabetes Father    Heart attack Maternal Grandfather     Social History Social History   Tobacco Use   Smoking status: Some Days    Types: Cigarettes   Smokeless tobacco: Never  Vaping Use   Vaping status: Never Used  Substance Use Topics   Alcohol use: No   Drug use: Not Currently    Types: Cocaine    Comment: clean 5 yrs     Allergies   Lithium, Seroquel [quetiapine], Abilify [aripiprazole], and Gabapentin   Review of Systems Review of Systems  Constitutional:  Negative for fever.  HENT:  Positive for ear pain, facial swelling and hearing loss.   Respiratory:  Positive for shortness of breath.   Neurological:  Positive for headaches.     Physical Exam Triage Vital Signs ED Triage Vitals  Encounter Vitals Group     BP      Systolic BP  Percentile      Diastolic BP Percentile      Pulse      Resp      Temp      Temp src      SpO2      Weight      Height      Head Circumference      Peak Flow      Pain Score      Pain Loc      Pain Education      Exclude from Growth Chart    No data found.  Updated Vital Signs BP (!) 150/89 (BP Location: Right Arm)   Pulse 66   Temp 97.8 F (36.6 C) (Oral)   Resp 17   SpO2 98%   Visual Acuity Right Eye Distance:   Left Eye Distance:   Bilateral Distance:    Right Eye Near: R Near: 20/50 Left Eye Near:  L Near: 20/25 Bilateral Near:  20/30  Physical Exam Vitals and nursing note reviewed.  Constitutional:      Appearance: Normal appearance. He is not ill-appearing.  HENT:     Head: Normocephalic and atraumatic.     Right Ear: Tympanic membrane, ear canal and external ear normal. There is no impacted cerumen.  Skin:    General: Skin is warm and dry.     Capillary Refill: Capillary refill takes less than 2 seconds.     Findings: Erythema and lesion present.  Neurological:     General: No focal deficit present.     Mental Status: He is alert and oriented to person, place, and time.     Cranial Nerves: No cranial nerve deficit.      UC Treatments / Results  Labs (all labs ordered are listed, but only abnormal results are displayed) Labs Reviewed - No data to display  EKG   Radiology No results found.  Procedures Procedures (including critical care time)  Medications Ordered in UC Medications - No data to display  Initial Impression / Assessment and Plan / UC Course  I have reviewed the triage vital signs and the nursing notes.  Pertinent labs & imaging results that were available during my care of the patient were reviewed by me and considered in my medical decision making (see chart for details).   Patient is a pleasant, nontoxic-appearing 53 year old male presenting for evaluation of facial cellulitis symptoms as outlined HPI above.  He is also  endorsing ocular involvement sitting his primary vision in his right eye.  His visual acuity reveals OU 20/30, OD 20/50, OS 20/25.  EOM is intact and his pupils are equal round reactive.  Normal red light reflex in the right eye.  He does have swelling to the right side of his face with erythema and a  lesion in his right temple.    The area of erythema and edema is nontender to palpation but it is slightly warmer than the surrounding tissue.  No particular induration or fluctuance noted.  There is concern the patient has facial cellulitis with ocular involvement.  I do feel that he needs imaging of his face and head given that he is experiencing a headache, has blurry vision of the right eye, and has a right-sided facial swelling.  I have therefore recommended that he be seen in the ER.  He has elected to go to [facility] via POV.  He left ambulatory and in stable condition.   Final Clinical Impressions(s) / UC Diagnoses   Final diagnoses:  Facial cellulitis  Blurry vision, right eye  Acute nonintractable headache, unspecified headache type     Discharge Instructions      As we discussed, your exam is consistent with facial cellulitis, and a skin infection, but I am concerned because you are experiencing a headache and blurry vision in your right eye.  There may be extension of the infection deeper and you therefore need imaging of your head and face.  Please go to the emergency department at [facility].  Please go now.     ED Prescriptions   None    PDMP not reviewed this encounter.   Becky Augusta, NP 01/22/24 [phone]

## 2024-01-22 NOTE — Discharge Instructions (Addendum)
 I discussed the case with Dr.  Rolley Sims who agreed that you could go over to their clinic and they would work you in to be evaluated for your eye vision changes.  At this time I am going to start you on antibiotics for potential infection of the face and your CT scan was reassuring.  Please show this paperwork to the provider and they can call me if there is any questions Dr Fuller Plan.  I am here until 3 PM.   1. Right periorbital/facial swelling and edema consistent with periorbital/facial cellulitis in the appropriate clinical setting. No CT evidence of post-septal orbital extension of infection. Right facial edema extends beyond the field of view inferiorly. Subtle asymmetric prominence of the right parotid gland, potentially reflecting a component of parotitis. 2. Chronic, depressed fracture deformity of the right orbital floor. 3. Right maxillary sinus disease as described. 4. Poor dentition with periodontal disease.

## 2024-01-22 NOTE — Discharge Instructions (Addendum)
 As we discussed, your exam is consistent with facial cellulitis, and a skin infection, but I am concerned because you are experiencing a headache and blurry vision in your right eye.  There may be extension of the infection deeper and you therefore need imaging of your head and face.  Please go to the emergency department at [facility].  Please go now.

## 2024-01-22 NOTE — ED Provider Notes (Signed)
 [facility] Provider Note    Event Date/Time   First MD Initiated Contact with Patient 01/22/24 1005     (approximate)   History   Facial Swelling   HPI  Nicholas Horne is a 53 y.o. male with history of intermittent reactive airway disease, OSA, GERD, bipolar, substance use disorder who comes in with blurry right eye, right-sided facial swelling.  Review of records patient was seen in urgent care today by Becky Augusta.  Patient was sent over here for evaluation of infection.  Patient reports has been ongoing for about 3 days now.  He states that he started noticing some blurry vision in his right eye 2 days ago and then noticing a knot on the side of his head.  He denies any pain in his ear or behind his ear.  He reports a mild headache.  Physical Exam   Triage Vital Signs: ED Triage Vitals  Encounter Vitals Group     BP 01/22/24 0946 (!) 156/107     Systolic BP Percentile --      Diastolic BP Percentile --      Pulse Rate 01/22/24 0946 69     Resp 01/22/24 0946 18     Temp 01/22/24 0946 (!) 97.5 F (36.4 C)     Temp Source 01/22/24 0946 Oral     SpO2 01/22/24 0946 100 %     Weight 01/22/24 0947 200 lb (90.7 kg)     Height 01/22/24 0947 5\' 10"  (1.778 m)     Head Circumference --      Peak Flow --      Pain Score 01/22/24 0946 5     Pain Loc --      Pain Education --      Exclude from Growth Chart --     Most recent vital signs: Vitals:   01/22/24 0946  BP: (!) 156/107  Pulse: 69  Resp: 18  Temp: (!) 97.5 F (36.4 C)  SpO2: 100%     General: Awake, no distress.  CV:  Good peripheral perfusion.  Resp:  Normal effort.  Abd:  No distention.  Other:  Patient has some erythema and a little bit of a raised area noted on his right temporal area.  His pupil is equal and reactive extraocular ocular movements are intact.     ED Results / Procedures / Treatments   Labs (all labs ordered are listed, but only abnormal results are  displayed) Labs Reviewed  CBC WITH DIFFERENTIAL/PLATELET - Abnormal; Notable for the following components:      Result Value   Monocytes Absolute 1.1 (*)    All other components within normal limits  BASIC METABOLIC PANEL    RADIOLOGY I have reviewed the CT personally and interpreted and no obvious abscess    PROCEDURES:  Critical Care performed: No  Procedures   MEDICATIONS ORDERED IN ED: Medications - No data to display   IMPRESSION / MDM / ASSESSMENT AND PLAN / ED COURSE  I reviewed the triage vital signs and the nursing notes.   Patient's presentation is most consistent with acute presentation with potential threat to life or bodily function.   Exam concerning for facial cellulitis but given patient reports some blurred vision, pain with eye movement will get CT imaging to rule out any type of orbital cellulitis, deeper abscess.  Will also get CT head just to ensure no evidence of anything deeper given he does report a little bit of a  headache as well.   1. Right periorbital/facial swelling and edema consistent with periorbital/facial cellulitis in the appropriate clinical setting. No CT evidence of post-septal orbital extension of infection. Right facial edema extends beyond the field of view inferiorly. Subtle asymmetric prominence of the right parotid gland, potentially reflecting a component of parotitis. 2. Chronic, depressed fracture deformity of the right orbital floor. 3. Right maxillary sinus disease as described. 4. Poor dentition with periodontal disease.  Discussed with patient CT imaging I did call back radiology to confirm that where the marker was that there was no abscess.  Does not sound that there was any abscess.  They also comment on the little bit of prominence of the right parotid gland but he is not tender over this area and I suspect that this is more likely reactive.  Will start patient on clindamycin to cover the possibility of MRSA or there  was a tooth source or sinusitis involvement..  Did discuss with the on-call ophthalmologist given the blurred vision and they will see him in clinic today.  His pressures in the right eye were normal at 13.  Fluorescein stain was negative.  Patient expressed understanding going over to the ophthalmology clinic and felt comfortable with discharge home with antibiotics  Final diagnoses:  Facial cellulitis     Rx / DC Orders   ED Discharge Orders          Ordered    clindamycin (CLEOCIN) 150 MG capsule  3 times daily        01/22/24 1145             Note:  This document was prepared using Dragon voice recognition software and may include unintentional dictation errors.   Concha Se, MD 01/22/24 1146

## 2024-01-22 NOTE — ED Triage Notes (Signed)
 Patient to ED via POV for facial swelling on right side. Pt states he had some visual difficulty in his right eye starting two days ago. Today- states vision is more blurry than normal  and having difficulty hear out of right ear.

## 2024-01-22 NOTE — ED Notes (Signed)
 See triage note  Presents with swelling to right side of face  States he noticed some blurred yesterday  Then last pm felt some swelling to that side of face  Denies any fever insect bite or truama

## 2024-01-22 NOTE — ED Triage Notes (Addendum)
 Woke up with right eye blurry 2 days ago, noticed yesterday that there was a big knot on the right side of his head. Can't hear out of right ear. Right side of head and face swollen. SOB started this morning. No injury or anything in his eye, patient had bronchitis 2 weeks ago. Was given abx and inhaler.

## 2024-08-21 ENCOUNTER — Emergency Department (HOSPITAL_COMMUNITY): Payer: Self-pay

## 2024-08-21 ENCOUNTER — Emergency Department (HOSPITAL_COMMUNITY)
Admission: EM | Admit: 2024-08-21 | Discharge: 2024-08-21 | Disposition: A | Discharge status: Home / Self Care | Payer: Self-pay | Attending: Emergency Medicine | Admitting: Emergency Medicine

## 2024-08-21 ENCOUNTER — Other Ambulatory Visit: Payer: Self-pay

## 2024-08-21 DIAGNOSIS — F10920 Alcohol use, unspecified with intoxication, uncomplicated: Secondary | ICD-10-CM

## 2024-08-21 DIAGNOSIS — F10120 Alcohol abuse with intoxication, uncomplicated: Secondary | ICD-10-CM | POA: Insufficient documentation

## 2024-08-21 DIAGNOSIS — F419 Anxiety disorder, unspecified: Secondary | ICD-10-CM | POA: Insufficient documentation

## 2024-08-21 DIAGNOSIS — K2970 Gastritis, unspecified, without bleeding: Secondary | ICD-10-CM | POA: Insufficient documentation

## 2024-08-21 LAB — CBC WITH DIFFERENTIAL/PLATELET
Abs Immature Granulocytes: 0.04 K/uL (ref 0.00–0.07)
Basophils Absolute: 0 K/uL (ref 0.0–0.1)
Basophils Relative: 0 %
Eosinophils Absolute: 0 K/uL (ref 0.0–0.5)
Eosinophils Relative: 0 %
HCT: 41 % (ref 39.0–52.0)
Hemoglobin: 13.9 g/dL (ref 13.0–17.0)
Immature Granulocytes: 1 %
Lymphocytes Relative: 17 %
Lymphs Abs: 1.5 K/uL (ref 0.7–4.0)
MCH: 30 pg (ref 26.0–34.0)
MCHC: 33.9 g/dL (ref 30.0–36.0)
MCV: 88.4 fL (ref 80.0–100.0)
Monocytes Absolute: 0.3 K/uL (ref 0.1–1.0)
Monocytes Relative: 3 %
Neutro Abs: 7.1 K/uL (ref 1.7–7.7)
Neutrophils Relative %: 79 %
Platelets: 205 K/uL (ref 150–400)
RBC: 4.64 MIL/uL (ref 4.22–5.81)
RDW: 13 % (ref 11.5–15.5)
WBC: 8.9 K/uL (ref 4.0–10.5)
nRBC: 0 % (ref 0.0–0.2)

## 2024-08-21 LAB — COMPREHENSIVE METABOLIC PANEL WITH GFR
ALT: 44 U/L (ref 0–44)
AST: 25 U/L (ref 15–41)
Albumin: 3.6 g/dL (ref 3.5–5.0)
Alkaline Phosphatase: 52 U/L (ref 38–126)
Anion gap: 10 (ref 5–15)
BUN: 13 mg/dL (ref 6–20)
CO2: 22 mmol/L (ref 22–32)
Calcium: 8.2 mg/dL — ABNORMAL LOW (ref 8.9–10.3)
Chloride: 107 mmol/L (ref 98–111)
Creatinine, Ser: 1.02 mg/dL (ref 0.61–1.24)
GFR, Estimated: >60 mL/min (ref >60)
Glucose, Bld: 127 mg/dL — ABNORMAL HIGH (ref 70–99)
Potassium: 4 mmol/L (ref 3.5–5.1)
Sodium: 139 mmol/L (ref 135–145)
Total Bilirubin: 0.5 mg/dL (ref 0.0–1.2)
Total Protein: 6.6 g/dL (ref 6.5–8.1)

## 2024-08-21 LAB — ETHANOL: Alcohol, Ethyl (B): 187 mg/dL — ABNORMAL HIGH (ref <15)

## 2024-08-21 LAB — SALICYLATE LEVEL: Salicylate Lvl: <7 mg/dL — ABNORMAL LOW (ref 7.0–30.0)

## 2024-08-21 LAB — ACETAMINOPHEN LEVEL: Acetaminophen (Tylenol), Serum: <10 ug/mL — ABNORMAL LOW (ref 10–30)

## 2024-08-21 MED ORDER — SODIUM CHLORIDE 0.9 % IV BOLUS
1000.0000 mL | Freq: Once | INTRAVENOUS | Status: AC
  Administered 2024-08-21: 1000 mL via INTRAVENOUS

## 2024-08-21 MED ORDER — FAMOTIDINE IN NACL 20-0.9 MG/50ML-% IV SOLN
20.0000 mg | Freq: Once | INTRAVENOUS | Status: AC
  Administered 2024-08-21: 20 mg via INTRAVENOUS
  Filled 2024-08-21: qty 50

## 2024-08-21 MED ORDER — ALUM & MAG HYDROXIDE-SIMETH 200-200-20 MG/5ML PO SUSP
30.0000 mL | Freq: Once | ORAL | Status: AC
  Administered 2024-08-21: 30 mL via ORAL
  Filled 2024-08-21: qty 30

## 2024-08-21 MED ORDER — PANTOPRAZOLE SODIUM 40 MG IV SOLR
40.0000 mg | Freq: Once | INTRAVENOUS | Status: AC
  Administered 2024-08-21: 40 mg via INTRAVENOUS
  Filled 2024-08-21: qty 10

## 2024-08-21 MED ORDER — ONDANSETRON 4 MG PO TBDP
8.0000 mg | ORAL_TABLET | Freq: Once | ORAL | Status: AC
  Administered 2024-08-21: 8 mg via ORAL
  Filled 2024-08-21: qty 2

## 2024-08-21 NOTE — ED Notes (Signed)
 Patient ambulated independently with EMT on stand by .

## 2024-08-21 NOTE — ED Provider Notes (Signed)
 [place] EMERGENCY DEPARTMENT AT [facility] Provider Note   CSN: [id number] Arrival date & time: 08/21/24  0018     Patient presents with: ETOH intoxication    Nicholas Horne is a 53 y.o. male.  Patient with past medical history significant for GERD, bipolar disorder, cocaine use disorder, anxiety state presents to the emergency department via EMS from home.  EMS reports they were initially called out for a possible stroke.  Upon arrival the patient was found to be alert but complaining of feeling out of my mind.  He endorses drinking approximately 6 beers and 2 shots of alcohol earlier this evening.  EMS administered 500 mL of normal saline.  He endorses nausea with no emesis.  He denies chest pain, shortness of breath, abdominal pain.  He is unable to describe his current feeling but states he got sweaty and felt unwell.  He does not believe his feelings are related to his alcohol consumption this evening.  No reported fall or trauma.   HPI     Prior to Admission medications   Medication Sig Start Date End Date Taking? Authorizing Provider  albuterol  (VENTOLIN  HFA) 108 (90 Base) MCG/ACT inhaler Inhale 2 puffs into the lungs every 6 (six) hours as needed for wheezing or shortness of breath. 12/29/23   Sofia, Leslie K, PA-C  doxepin  (SINEQUAN ) 25 MG capsule Take 1 capsule (25 mg total) by mouth at bedtime. 09/09/23 10/09/23  Nicholaus Brad RAMAN, NP  doxycycline  (VIBRAMYCIN ) 100 MG capsule Take 1 capsule (100 mg total) by mouth 2 (two) times daily. 12/29/23   Sofia, Leslie K, PA-C  hydrOXYzine  (ATARAX ) 25 MG tablet Take 1 tablet (25 mg total) by mouth every 8 (eight) hours as needed for anxiety. 09/23/23   Lang Dover, MD  risperiDONE  (RISPERDAL ) 1 MG tablet Take 1 tablet (1 mg total) by mouth at bedtime. 09/09/23 10/09/23  Nicholaus Brad RAMAN, NP    Allergies: Lithium, Seroquel [quetiapine], Abilify  [aripiprazole ], and Gabapentin    Review of Systems  Updated Vital Signs BP  125/67 (BP Location: Left Arm)   Pulse 64   Temp 97.7 F (36.5 C) (Oral)   Resp 20   SpO2 94%   Physical Exam Vitals and nursing note reviewed.  Constitutional:      General: He is not in acute distress.    Appearance: He is well-developed.  HENT:     Head: Normocephalic and atraumatic.  Eyes:     Conjunctiva/sclera: Conjunctivae normal.  Cardiovascular:     Rate and Rhythm: Normal rate and regular rhythm.     Heart sounds: No murmur heard. Pulmonary:     Effort: Pulmonary effort is normal. No respiratory distress.     Breath sounds: Normal breath sounds.  Abdominal:     Palpations: Abdomen is soft.     Tenderness: There is no abdominal tenderness.  Musculoskeletal:        General: No swelling.     Cervical back: Neck supple.  Skin:    General: Skin is warm and dry.     Capillary Refill: Capillary refill takes less than 2 seconds.  Neurological:     General: No focal deficit present.     Mental Status: He is alert.     Sensory: No sensory deficit.     Motor: No weakness.  Psychiatric:     Comments: Patient appears anxious     (all labs ordered are listed, but only abnormal results are displayed) Labs Reviewed  COMPREHENSIVE METABOLIC  PANEL WITH GFR - Abnormal; Notable for the following components:      Result Value   Glucose, Bld 127 (*)    Calcium 8.2 (*)    All other components within normal limits  SALICYLATE LEVEL - Abnormal; Notable for the following components:   Salicylate Lvl <7.0 (*)    All other components within normal limits  ACETAMINOPHEN  LEVEL - Abnormal; Notable for the following components:   Acetaminophen  (Tylenol ), Serum <10 (*)    All other components within normal limits  ETHANOL - Abnormal; Notable for the following components:   Alcohol, Ethyl (B) 187 (*)    All other components within normal limits  CBC WITH DIFFERENTIAL/PLATELET    EKG: None  Radiology: CT Head Wo Contrast Result Date: 08/21/2024 CLINICAL DATA:  Initial  evaluation for acute mental status change. EXAM: CT HEAD WITHOUT CONTRAST TECHNIQUE: Contiguous axial images were obtained from the base of the skull through the vertex without intravenous contrast. RADIATION DOSE REDUCTION: This exam was performed according to the departmental dose-optimization program which includes automated exposure control, adjustment of the mA and/or kV according to patient size and/or use of iterative reconstruction technique. COMPARISON:  Prior study from 01/22/2024. FINDINGS: Brain: Cerebral volume within normal limits for patient age. No acute intracranial hemorrhage. No acute large vessel territory infarct. No mass lesion, midline shift, or mass effect. Ventricles are normal in size without hydrocephalus. No extra-axial fluid collection. Vascular: No abnormal hyperdense vessel. Skull: Scalp soft tissues demonstrate no acute abnormality. Calvarium intact. Sinuses/Orbits: Globes and orbital soft tissues within normal limits. Mild scattered mucosal thickening about the ethmoidal air cells. Right maxillary sinus retention cyst noted. No mastoid effusion. IMPRESSION: Normal head CT. No acute intracranial abnormality. Electronically Signed   By: Morene Hoard M.D.   On: 08/21/2024 01:56     Procedures   Medications Ordered in the ED  sodium chloride  0.9 % bolus 1,000 mL (0 mLs Intravenous Stopped 08/21/24 0248)  ondansetron  (ZOFRAN -ODT) disintegrating tablet 8 mg (8 mg Oral Given 08/21/24 0039)  famotidine (PEPCID) IVPB 20 mg premix (0 mg Intravenous Stopped 08/21/24 0344)  pantoprazole  (PROTONIX ) injection 40 mg (40 mg Intravenous Given 08/21/24 0310)  alum & mag hydroxide-simeth (MAALOX/MYLANTA) 200-200-20 MG/5ML suspension 30 mL (30 mLs Oral Given 08/21/24 0558)                                    Medical Decision Making Amount and/or Complexity of Data Reviewed Labs: ordered. Radiology: ordered.  Risk Prescription drug management.   This patient presents to the ED  for concern of mental status changes, this involves an extensive number of treatment options, and is a complaint that carries with it a high risk of complications and morbidity.  The differential diagnosis includes alcohol intoxication, substance abuse, intracranial abnormality, others   Co morbidities / Chronic conditions that complicate the patient evaluation  History of substance abuse, anxiety   Additional history obtained:  Additional history obtained from EMR   Lab Tests:  I Ordered, and personally interpreted labs.  The pertinent results include: Ethanol level 187   Imaging Studies ordered:  I ordered imaging studies including CT head I independently visualized and interpreted imaging which showed no acute findings I agree with the radiologist interpretation   Cardiac Monitoring: / EKG:  The patient was maintained on a cardiac monitor.  I personally viewed and interpreted the cardiac monitored which showed an underlying rhythm  of: Sinus rhythm   Problem List / ED Course / Critical interventions / Medication management   I ordered medication including Zofran , saline bolus, Pepcid, Protonix  Reevaluation of the patient after these medicines showed that the patient improved I have reviewed the patients home medicines and have made adjustments as needed   Social Determinants of Health:  Patient is an occasional smoker, has no health insurance   Test / Admission - Considered:  Patient's symptoms appear to be due to his alcohol use.  He did have some abdominal pain which is consistent with gastritis with some improvement with Pepcid and Protonix .  He is tolerating oral intake and ambulating.  He had an initial EKG which showed a type II Mobitz block but appeared to have some artifact.  A repeat EKG was obtained which showed a sinus rhythm without heart block.  At this time there is no indication for further emergent workup or admission.  Patient appears stable for  discharge home      Final diagnoses:  Alcoholic intoxication without complication  Gastritis without bleeding, unspecified chronicity, unspecified gastritis type    ED Discharge Orders     None          Logan Ubaldo KATHEE DEVONNA 08/21/24 0617    Theadore Ozell HERO, MD 08/21/24 [phone]

## 2024-08-21 NOTE — ED Triage Notes (Signed)
 Patient arrived with EMS from home , intoxicated with ETOH this evening , alert and oriented at arrival CBG=127 , received NS 500 ml by EMS prior to arrival , no emesis .

## 2024-08-21 NOTE — Discharge Instructions (Addendum)
 Your symptoms this evening do appear to be related to your alcohol use.  You had a reassuring CT scan and overall had reassuring lab work.  The stomach pain is likely related to alcohol use and is consistent with something called gastritis or inflammation of the stomach.  Avoiding heavy alcohol use can help with the symptoms.  You may also try over-the-counter medication such as Pepcid for acid control.  Return to the emergency department if you develop any life-threatening symptoms.
